# Patient Record
Sex: Male | Born: 1945 | Race: White | Hispanic: No | Smoking: Former smoker
Health system: Southern US, Community
[De-identification: ages and names within clinical notes are randomized; demographics above are authoritative.]

## PROBLEM LIST (undated history)

## (undated) DIAGNOSIS — K7460 Unspecified cirrhosis of liver: Secondary | ICD-10-CM

## (undated) DIAGNOSIS — Z720 Tobacco use: Secondary | ICD-10-CM

## (undated) DIAGNOSIS — C679 Malignant neoplasm of bladder, unspecified: Secondary | ICD-10-CM

## (undated) DIAGNOSIS — N401 Enlarged prostate with lower urinary tract symptoms: Secondary | ICD-10-CM

## (undated) DIAGNOSIS — K279 Peptic ulcer, site unspecified, unspecified as acute or chronic, without hemorrhage or perforation: Secondary | ICD-10-CM

## (undated) DIAGNOSIS — M47812 Spondylosis without myelopathy or radiculopathy, cervical region: Secondary | ICD-10-CM

## (undated) DIAGNOSIS — M7581 Other shoulder lesions, right shoulder: Secondary | ICD-10-CM

## (undated) DIAGNOSIS — B182 Chronic viral hepatitis C: Secondary | ICD-10-CM

## (undated) HISTORY — DX: Other shoulder lesions, right shoulder: M75.81

## (undated) HISTORY — DX: Peptic ulcer, site unspecified, unspecified as acute or chronic, without hemorrhage or perforation: K27.9

## (undated) HISTORY — DX: Chronic viral hepatitis C: B18.2

## (undated) HISTORY — DX: Tobacco use: Z72.0

## (undated) HISTORY — DX: Benign prostatic hyperplasia with lower urinary tract symptoms: N40.1

## (undated) HISTORY — DX: Unspecified cirrhosis of liver: K74.60

## (undated) HISTORY — DX: Malignant neoplasm of bladder, unspecified: C67.9

## (undated) HISTORY — DX: Spondylosis without myelopathy or radiculopathy, cervical region: M47.812

---

## 1974-09-11 HISTORY — PX: REPAIR PERONEAL TENDONS ANKLE: SUR1201

## 1994-09-11 HISTORY — PX: HERNIA REPAIR: SHX51

## 2010-09-11 HISTORY — PX: OTHER SURGICAL HISTORY: SHX169

## 2014-12-14 DIAGNOSIS — N401 Enlarged prostate with lower urinary tract symptoms: Secondary | ICD-10-CM | POA: Insufficient documentation

## 2014-12-14 DIAGNOSIS — B182 Chronic viral hepatitis C: Secondary | ICD-10-CM | POA: Insufficient documentation

## 2014-12-14 DIAGNOSIS — C679 Malignant neoplasm of bladder, unspecified: Secondary | ICD-10-CM | POA: Insufficient documentation

## 2015-03-08 ENCOUNTER — Encounter: Payer: Medicare Other | Attending: Surgery | Admitting: Surgery

## 2015-03-08 DIAGNOSIS — B1920 Unspecified viral hepatitis C without hepatic coma: Secondary | ICD-10-CM | POA: Insufficient documentation

## 2015-03-08 DIAGNOSIS — S81811A Laceration without foreign body, right lower leg, initial encounter: Secondary | ICD-10-CM | POA: Diagnosis not present

## 2015-03-08 DIAGNOSIS — X58XXXA Exposure to other specified factors, initial encounter: Secondary | ICD-10-CM | POA: Diagnosis not present

## 2015-03-08 DIAGNOSIS — F17218 Nicotine dependence, cigarettes, with other nicotine-induced disorders: Secondary | ICD-10-CM | POA: Insufficient documentation

## 2015-03-08 DIAGNOSIS — L03115 Cellulitis of right lower limb: Secondary | ICD-10-CM | POA: Diagnosis not present

## 2015-03-08 DIAGNOSIS — Z8551 Personal history of malignant neoplasm of bladder: Secondary | ICD-10-CM | POA: Insufficient documentation

## 2015-03-08 DIAGNOSIS — K7460 Unspecified cirrhosis of liver: Secondary | ICD-10-CM | POA: Insufficient documentation

## 2015-03-09 NOTE — Progress Notes (Signed)
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 Signature(s) Signed: 03/08/2015 4:47:08 PM By: Christin Fudge MD, FACS Entered By: Christin Fudge on 03/08/2015 14:26:33

## 2015-03-09 NOTE — Progress Notes (Signed)
BOSTYN, BOGIE ([id number]) Visit Report for 03/08/2015 Abuse/Suicide Risk Screen Details Patient Name: [name] Date of Service: 03/08/2015 1:00 PM Medical Record Number: [id number] Patient Account Number: [id number] Date of Birth/Sex: Jan 10, 1946 (69 y.o. Male) Treating RN: Montey Hora Primary Care Physician: BABAOFF, MARCUS Other Clinician: Referring Physician: BABAOFF, MARCUS Treating Physician/Extender: Frann Rider in Treatment: 0 Abuse/Suicide Risk Screen Items Answer ABUSE/SUICIDE RISK SCREEN: Has anyone close to you tried to hurt or harm you recentlyo No Do you feel uncomfortable with anyone in your familyo No Has anyone forced you do things that you didnot want to doo No Do you have any thoughts of harming yourselfo No Patient displays signs or symptoms of abuse and/or neglect. No Electronic Signature(s) Signed: 03/08/2015 5:36:15 PM By: Montey Hora Entered By: Montey Hora on 03/08/2015 13:31:05 [name] ([id number]) -------------------------------------------------------------------------------- Activities of Daily Living Details Patient Name: [name] Date of Service: 03/08/2015 1:00 PM Medical Record Number: [id number] Patient Account Number: [id number] Date of Birth/Sex: 02-21-1946 (69 y.o. Male) Treating RN: Montey Hora Primary Care Physician: Derinda Late Other Clinician: Referring Physician: BABAOFF, MARCUS Treating Physician/Extender: Frann Rider in Treatment: 0 Activities of Daily Living Items Answer Activities of Daily Living (Please select one for each item) Drive Automobile Completely Able Take Medications Completely Able Use Telephone Completely [place] for Appearance Completely Able Use Toilet Completely Able Bath / Shower Completely Able Dress Self Completely Able Feed Self Completely Able Walk Completely Able Get In / Out Bed Completely Able Housework Completely Able Prepare Meals  Completely [place] for Self Completely Able Electronic Signature(s) Signed: 03/08/2015 5:36:15 PM By: Montey Hora Entered By: Montey Hora on 03/08/2015 13:31:25 [name] ([id number]) -------------------------------------------------------------------------------- Education Assessment Details Patient Name: [name] Date of Service: 03/08/2015 1:00 PM Medical Record Number: [id number] Patient Account Number: [id number] Date of Birth/Sex: 21-Sep-1945 (69 y.o. Male) Treating RN: Montey Hora Primary Care Physician: BABAOFF, MARCUS Other Clinician: Referring Physician: BABAOFF, MARCUS Treating Physician/Extender: Frann Rider in Treatment: 0 Primary Learner Assessed: Patient Learning Preferences/Education Level/Primary Language Learning Preference: Explanation, Demonstration Highest Education Level: College or Above Preferred Language: English Cognitive Barrier Assessment/Beliefs Language Barrier: No Translator Needed: No Memory Deficit: No Emotional Barrier: No Cultural/Religious Beliefs Affecting Medical No Care: Physical Barrier Assessment Impaired Vision: No Impaired Hearing: No Decreased Hand dexterity: No Knowledge/Comprehension Assessment Knowledge Level: Medium Comprehension Level: Medium Ability to understand written Medium instructions: Ability to understand verbal Medium instructions: Motivation Assessment Anxiety Level: Calm Cooperation: Cooperative Education Importance: Acknowledges Need Interest in Health Problems: Asks Questions Perception: Coherent Willingness to Engage in Self- Medium Management Activities: Readiness to Engage in Self- Medium Management Activities: Electronic Signature(s) IMAD, [place] ([id number]) Signed: 03/08/2015 5:36:15 PM By: Montey Hora Entered By: Montey Hora on 03/08/2015 13:31:56 [name]  ([id number]) -------------------------------------------------------------------------------- Fall Risk Assessment Details Patient Name: [name] Date of Service: 03/08/2015 1:00 PM Medical Record Number: [id number] Patient Account Number: [id number] Date of Birth/Sex: [birth year] (69 y.o. Male) Treating RN: Montey Hora Primary Care Physician: BABAOFF, MARCUS Other Clinician: Referring Physician: BABAOFF, MARCUS Treating Physician/Extender: Frann Rider in Treatment: 0 Fall Risk Assessment Items FALL RISK ASSESSMENT: History of falling - immediate or within 3 months 0 No Secondary diagnosis 0 No Ambulatory aid None/bed rest/wheelchair/nurse 0 Yes Crutches/cane/walker 0 No Furniture 0 No IV Access/Saline Lock 0 No Gait/Training Normal/bed rest/immobile 0 Yes Weak 0 No Impaired 0 No Mental Status Oriented to own ability 0 Yes Electronic Signature(s) Signed: 03/08/2015 5:36:15 PM By: Montey Hora Entered By: Montey Hora  on 03/08/2015 13:32:02 CHURCHILL, GRIMSLEY ([id number]) -------------------------------------------------------------------------------- Foot Assessment Details Patient Name: [name] Date of Service: 03/08/2015 1:00 PM Medical Record Number: [id number] Patient Account Number: [id number] Date of Birth/Sex: 11/15/1945 (69 y.o. Male) Treating RN: Montey Hora Primary Care Physician: BABAOFF, MARCUS Other Clinician: Referring Physician: BABAOFF, MARCUS Treating Physician/Extender: Frann Rider in Treatment: 0 Foot Assessment Items Site Locations + = Sensation present, - = Sensation absent, C = Callus, U = Ulcer R = Redness, W = Warmth, M = Maceration, PU = Pre-ulcerative lesion F = Fissure, S = Swelling, D = Dryness Assessment Right: Left: Other Deformity: No No Prior Foot Ulcer: No No Prior Amputation: No No Charcot Joint: No No Ambulatory Status: Ambulatory Without Help Gait: Steady Electronic Signature(s) Signed:  03/08/2015 5:36:15 PM By: Montey Hora Entered By: Montey Hora on 03/08/2015 13:51:49 [name] ([id number]) -------------------------------------------------------------------------------- Nutrition Risk Assessment Details Patient Name: [name] Date of Service: 03/08/2015 1:00 PM Medical Record Number: [id number] Patient Account Number: [id number] Date of Birth/Sex: 03-Nov-1945 (69 y.o. Male) Treating RN: Montey Hora Primary Care Physician: Derinda Late Other Clinician: Referring Physician: BABAOFF, MARCUS Treating Physician/Extender: Frann Rider in Treatment: 0 Height (in): Weight (lbs): Body Mass Index (BMI): Nutrition Risk Assessment Items NUTRITION RISK SCREEN: I have an illness or condition that made me change the kind and/or 0 No amount of food I eat I eat fewer than two meals per day 0 No I eat few fruits and vegetables, or milk products 0 No I have three or more drinks of beer, liquor or wine almost every day 0 No I have tooth or mouth problems that make it hard for me to eat 0 No I don't always have enough money to buy the food I need 0 No I eat alone most of the time 0 No I take three or more different prescribed or over-the-counter drugs a 1 Yes day Without wanting to, I have lost or gained 10 pounds in the last six 0 No months I am not always physically able to shop, cook and/or feed myself 0 No Nutrition Protocols Good Risk Protocol 0 No interventions needed Moderate Risk Protocol Electronic Signature(s) Signed: 03/08/2015 5:36:15 PM By: Montey Hora Entered By: Montey Hora on 03/08/2015 13:32:10

## 2015-03-09 NOTE — Progress Notes (Signed)
[name], [name] ([id number]) Visit Report for 03/08/2015 Allergy List Details Patient Name: [name], [name] Date of Service: 03/08/2015 1:00 PM Medical Record Number: [id number] Patient Account Number: [id number] Date of Birth/Sex: Oct 31, 1945 (69 y.o. Male) Treating RN: Montey Hora Primary Care Physician: Derinda Late Other Clinician: Referring Physician: BABAOFF, MARCUS Treating Physician/Extender: Frann Rider in Treatment: 0 Allergies Active Allergies iodine Reaction: anaphylaxis Allergy Notes Electronic Signature(s) Signed: 03/08/2015 5:36:15 PM By: Montey Hora Entered By: Montey Hora on 03/08/2015 13:25:39 [name] ([id number]) -------------------------------------------------------------------------------- [place] Details Patient Name: [name] Date of Service: 03/08/2015 1:00 PM Medical Record Number: [id number] Patient Account Number: [id number] Date of Birth/Sex: 13-May-1946 (69 y.o. Male) Treating RN: Montey Hora Primary Care Physician: BABAOFF, MARCUS Other Clinician: Referring Physician: BABAOFF, MARCUS Treating Physician/Extender: Frann Rider in Treatment: 0 Visit Information Patient Arrived: Ambulatory Arrival Time: 13:24 Accompanied By: self Transfer Assistance: None Patient Identification Verified: Yes Secondary Verification Process Yes Completed: Patient Has Alerts: Yes Patient Alerts: Hep C Electronic Signature(s) Signed: 03/08/2015 5:36:15 PM By: Montey Hora Entered By: Montey Hora on 03/08/2015 13:25:17 [name] ([id number]) -------------------------------------------------------------------------------- Clinic Level of Care Assessment Details Patient Name: [name] Date of Service: 03/08/2015 1:00 PM Medical Record Number: [id number] Patient Account Number: [id number] Date of Birth/Sex: October 24, 1945 (69 y.o. Male) Treating RN: Montey Hora Primary Care Physician:  BABAOFF, MARCUS Other Clinician: Referring Physician: BABAOFF, MARCUS Treating Physician/Extender: Frann Rider in Treatment: 0 Clinic Level of Care Assessment Items TOOL 2 Quantity Score '[]'$  - Use when only an EandM is performed on the INITIAL visit 0 ASSESSMENTS - Nursing Assessment / Reassessment X - General Physical Exam (combine w/ comprehensive assessment (listed just 1 20 below) when performed on new pt. evals) X - Comprehensive Assessment (HX, ROS, Risk Assessments, Wounds Hx, etc.) 1 25 ASSESSMENTS - Wound and Skin Assessment / Reassessment X - Simple Wound Assessment / Reassessment - one wound 1 5 '[]'$  - Complex Wound Assessment / Reassessment - multiple wounds 0 '[]'$  - Dermatologic / Skin Assessment (not related to wound area) 0 ASSESSMENTS - Ostomy and/or Continence Assessment and Care '[]'$  - Incontinence Assessment and Management 0 '[]'$  - Ostomy Care Assessment and Management (repouching, etc.) 0 PROCESS - Coordination of Care X - Simple Patient / Family Education for ongoing care 1 15 '[]'$  - Complex (extensive) Patient / Family Education for ongoing care 0 X - Staff obtains Programmer, systems, Records, Test Results / Process Orders 1 10 '[]'$  - Staff telephones HHA, Nursing Homes / Clarify orders / etc 0 '[]'$  - Routine Transfer to another Facility (non-emergent condition) 0 '[]'$  - Routine Hospital Admission (non-emergent condition) 0 X - New Admissions / Biomedical engineer / Ordering NPWT, Apligraf, etc. 1 15 '[]'$  - Emergency Hospital Admission (emergent condition) 0 X - Simple Discharge Coordination 1 10 [name], Roy ([id number]) '[]'$  - Complex (extensive) Discharge Coordination 0 PROCESS - Special Needs '[]'$  - Pediatric / Minor Patient Management 0 '[]'$  - Isolation Patient Management 0 '[]'$  - Hearing / Language / Visual special needs 0 '[]'$  - Assessment of Community assistance (transportation, D/C planning, etc.) 0 '[]'$  - Additional assistance / Altered mentation 0 '[]'$  - Support  Surface(s) Assessment (bed, cushion, seat, etc.) 0 INTERVENTIONS - Wound Cleansing / Measurement X - Wound Imaging (photographs - any number of wounds) 1 5 '[]'$  - Wound Tracing (instead of photographs) 0 X - Simple Wound Measurement - one wound 1 5 '[]'$  - Complex Wound Measurement - multiple wounds 0 X - Simple Wound Cleansing - one wound 1 5 '[]'$  -  Complex Wound Cleansing - multiple wounds 0 INTERVENTIONS - Wound Dressings X - Small Wound Dressing one or multiple wounds 1 10 '[]'$  - Medium Wound Dressing one or multiple wounds 0 '[]'$  - Large Wound Dressing one or multiple wounds 0 '[]'$  - Application of Medications - injection 0 INTERVENTIONS - Miscellaneous '[]'$  - External ear exam 0 '[]'$  - Specimen Collection (cultures, biopsies, blood, body fluids, etc.) 0 '[]'$  - Specimen(s) / Culture(s) sent or taken to Lab for analysis 0 '[]'$  - Patient Transfer (multiple staff / Harrel Lemon Lift / Similar devices) 0 '[]'$  - Simple Staple / Suture removal (25 or less) 0 '[]'$  - Complex Staple / Suture removal (26 or more) 0 [name], [name] ([id number]) '[]'$  - Hypo / Hyperglycemic Management (close monitor of Blood Glucose) 0 '[]'$  - Ankle / Brachial Index (ABI) - do not check if billed separately 0 Has the patient been seen at the hospital within the last three years: Yes Total Score: 125 Level Of Care: New/Established - Level 4 Electronic Signature(s) Signed: 03/08/2015 5:36:15 PM By: Montey Hora Entered By: Montey Hora on 03/08/2015 14:04:42 [name] ([id number]) -------------------------------------------------------------------------------- Encounter Discharge Information Details Patient Name: [name] Date of Service: 03/08/2015 1:00 PM Medical Record Number: [id number] Patient Account Number: [id number] Date of Birth/Sex: [birth year] (69 y.o. Male) Treating RN: Montey Hora Primary Care Physician: Derinda Late Other Clinician: Referring Physician: BABAOFF, MARCUS Treating Physician/Extender:  Frann Rider in Treatment: 0 Encounter Discharge Information Items Discharge Pain Level: 0 Discharge Condition: Stable Ambulatory Status: Ambulatory Discharge Destination: Home Transportation: Private Auto Accompanied By: self Schedule Follow-up Appointment: Yes Medication Reconciliation completed and provided to Patient/Care No Parisha Beaulac: Provided on Clinical Summary of Care: 03/08/2015 Form Type Recipient Paper Patient JD Electronic Signature(s) Signed: 03/08/2015 2:20:50 PM By: Ruthine Dose Entered By: Ruthine Dose on 03/08/2015 14:20:50 [name] ([id number]) -------------------------------------------------------------------------------- Lower Extremity Assessment Details Patient Name: [name] Date of Service: 03/08/2015 1:00 PM Medical Record Number: [id number] Patient Account Number: [id number] Date of Birth/Sex: Apr 06, 1946 (69 y.o. Male) Treating RN: Montey Hora Primary Care Physician: Derinda Late Other Clinician: Referring Physician: BABAOFF, MARCUS Treating Physician/Extender: Frann Rider in Treatment: 0 Edema Assessment Assessed: [Left: No] [Right: No] E[Left: dema] [Right: :] Calf Left: Right: Point of Measurement: 35 cm From Medial Instep 35.4 cm 36.5 cm Ankle Left: Right: Point of Measurement: 11 cm From Medial Instep 23 cm 25.8 cm Vascular Assessment Pulses: Posterior Tibial Palpable: [Left:Yes] [Right:Yes] Doppler: [Left:Multiphasic] [Right:Multiphasic] Dorsalis Pedis Palpable: [Left:Yes] [Right:Yes] Doppler: [Left:Multiphasic] [Right:Multiphasic] Extremity colors, hair growth, and conditions: Extremity Color: [Left:Normal] [Right:Red] Hair Growth on Extremity: [Left:Yes] [Right:Yes] Temperature of Extremity: [Left:Warm] [Right:Warm] Capillary Refill: [Left:< 3 seconds] [Right:< 3 seconds] Toe Nail Assessment Left: Right: Thick: No No Discolored: No No Deformed: No No Improper Length and Hygiene: No  No Electronic Signature(s) Signed: 03/08/2015 5:36:15 PM By: Veva Holes, Broadus John ([id number]) Entered By: Montey Hora on 03/08/2015 13:43:48 [name] ([id number]) -------------------------------------------------------------------------------- Multi Wound Chart Details Patient Name: [name] Date of Service: 03/08/2015 1:00 PM Medical Record Number: [id number] Patient Account Number: [id number] Date of Birth/Sex: 12-Jan-1946 (69 y.o. Male) Treating RN: Montey Hora Primary Care Physician: BABAOFF, MARCUS Other Clinician: Referring Physician: BABAOFF, MARCUS Treating Physician/Extender: Frann Rider in Treatment: 0 Vital Signs Height(in): 70 Pulse(bpm): 69 Weight(lbs): 168 Blood Pressure 110/55 (mmHg): Body Mass Index(BMI): 24 Temperature(F): 98.2 Respiratory Rate 18 (breaths/min): Photos: [1:No Photos] [N/A:N/A] Wound Location: [1:Right Lower Leg - Medial] [N/A:N/A] Wounding Event: [1:Trauma] [N/A:N/A] Primary Etiology: [1:Trauma, Other] [N/A:N/A] Comorbid History: [1:Cirrhosis , Hepatitis C] [  N/A:N/A] Date Acquired: [1:02/26/2015] [N/A:N/A] Weeks of Treatment: [1:0] [N/A:N/A] Wound Status: [1:Open] [N/A:N/A] Measurements L x W x D 10x2.6x0.1 [N/A:N/A] (cm) Area (cm) : [1:20.42] [N/A:N/A] Volume (cm) : [1:2.042] [N/A:N/A] % Reduction in Area: [1:0.00%] [N/A:N/A] % Reduction in Volume: 0.00% [N/A:N/A] Classification: [1:Full Thickness Without Exposed Support Structures] [N/A:N/A] Exudate Amount: [1:Small] [N/A:N/A] Exudate Type: [1:Sanguinous] [N/A:N/A] Exudate Color: [1:red] [N/A:N/A] Wound Margin: [1:Flat and Intact] [N/A:N/A] Granulation Amount: [1:Small (1-33%)] [N/A:N/A] Granulation Quality: [1:Red] [N/A:N/A] Necrotic Amount: [1:None Present (0%)] [N/A:N/A] Exposed Structures: [1:Fascia: No Fat: No Tendon: No Muscle: No Joint: No Bone: No] [N/A:N/A] Limited to Skin Breakdown Epithelialization: Medium (34-66%) N/A  N/A Periwound Skin Texture: Edema: No N/A N/A Excoriation: No Induration: No Callus: No Crepitus: No Fluctuance: No Friable: No Rash: No Scarring: No Periwound Skin Maceration: No N/A N/A Moisture: Moist: No Dry/Scaly: No Periwound Skin Color: Atrophie Blanche: No N/A N/A Cyanosis: No Ecchymosis: No Erythema: No Hemosiderin Staining: No Mottled: No Pallor: No Rubor: No Temperature: No Abnormality N/A N/A Tenderness on No N/A N/A Palpation: Wound Preparation: Ulcer Cleansing: N/A N/A Rinsed/Irrigated with Saline Topical Anesthetic Applied: Other: lidocaine 4% Treatment Notes Electronic Signature(s) Signed: 03/08/2015 5:36:15 PM By: Montey Hora Entered By: Montey Hora on 03/08/2015 13:52:24 [name] ([id number]) -------------------------------------------------------------------------------- Multi-Disciplinary Care Plan Details Patient Name: [name] Date of Service: 03/08/2015 1:00 PM Medical Record Number: [id number] Patient Account Number: [id number] Date of Birth/Sex: 24-Jun-1946 (69 y.o. Male) Treating RN: Montey Hora Primary Care Physician: BABAOFF, MARCUS Other Clinician: Referring Physician: BABAOFF, MARCUS Treating Physician/Extender: Frann Rider in Treatment: 0 Active Inactive Orientation to the Wound Care Program Nursing Diagnoses: Knowledge deficit related to the wound healing center program Goals: Patient/caregiver will verbalize understanding of the [facility] Date Initiated: 03/08/2015 Goal Status: Active Interventions: Provide education on orientation to the wound center Notes: Wound/Skin Impairment Nursing Diagnoses: Impaired tissue integrity Goals: Ulcer/skin breakdown will have a volume reduction of 30% by week 4 Date Initiated: 03/08/2015 Goal Status: Active Interventions: Assess ulceration(s) every visit Notes: Electronic Signature(s) Signed: 03/08/2015 5:36:15 PM By: Montey Hora Entered By: Montey Hora on 03/08/2015 13:52:54 [name] ([id number]) -------------------------------------------------------------------------------- Patient/Caregiver Education Details Patient Name: [name] Date of Service: 03/08/2015 1:00 PM Medical Record Number: [id number] Patient Account Number: [id number] Date of Birth/Gender: [birth year] (69 y.o. Male) Treating RN: Montey Hora Primary Care Physician: Derinda Late Other Clinician: Referring Physician: BABAOFF, MARCUS Treating Physician/Extender: Frann Rider in Treatment: 0 Education Assessment Education Provided To: Patient Education Topics Provided Wound/Skin Impairment: Handouts: Other: s/s of wound infection by Dr Con Memos; wound care as ordered Methods: Demonstration, Explain/Verbal Responses: State content correctly Electronic Signature(s) Signed: 03/08/2015 5:36:15 PM By: Montey Hora Entered By: Montey Hora on 03/08/2015 14:05:36 [name] ([id number]) -------------------------------------------------------------------------------- Wound Assessment Details Patient Name: [name] Date of Service: 03/08/2015 1:00 PM Medical Record Number: [id number] Patient Account Number: [id number] Date of Birth/Sex: 07-17-1946 (69 y.o. Male) Treating RN: Montey Hora Primary Care Physician: BABAOFF, MARCUS Other Clinician: Referring Physician: BABAOFF, MARCUS Treating Physician/Extender: Frann Rider in Treatment: 0 Wound Status Wound Number: 1 Primary Etiology: Trauma, Other Wound Location: Right Lower Leg - Medial Wound Status: Open Wounding Event: Trauma Comorbid History: Cirrhosis , Hepatitis C Date Acquired: 02/26/2015 Weeks Of Treatment: 0 Clustered Wound: No Photos Photo Uploaded By: Montey Hora on 03/08/2015 17:35:23 Wound Measurements Length: (cm) 10 Width: (cm) 2.6 Depth: (cm) 0.1 Area: (cm) 20.42 Volume: (cm) 2.042 % Reduction in  Area: 0% % Reduction in Volume: 0% Epithelialization: Medium (34-66%) Tunneling: No  Undermining: No Wound Description Full Thickness Without Exposed Foul Odor Afte Classification: Support Structures Wound Margin: Flat and Intact Exudate Small Amount: Exudate Type: Sanguinous Exudate Color: red r Cleansing: No Wound Bed Granulation Amount: Small (1-33%) Exposed Structure Granulation Quality: Red Fascia Exposed: No Necrotic Amount: None Present (0%) Fat Layer Exposed: No Davlin, Elizar ([id number]) Tendon Exposed: No Muscle Exposed: No Joint Exposed: No Bone Exposed: No Limited to Skin Breakdown Periwound Skin Texture Texture Color No Abnormalities Noted: No No Abnormalities Noted: No Callus: No Atrophie Blanche: No Crepitus: No Cyanosis: No Excoriation: No Ecchymosis: No Fluctuance: No Erythema: No Friable: No Hemosiderin Staining: No Induration: No Mottled: No Localized Edema: No Pallor: No Rash: No Rubor: No Scarring: No Temperature / Pain Moisture Temperature: No Abnormality No Abnormalities Noted: No Dry / Scaly: No Maceration: No Moist: No Wound Preparation Ulcer Cleansing: Rinsed/Irrigated with Saline Topical Anesthetic Applied: Other: lidocaine 4%, Treatment Notes Wound #1 (Right, Medial Lower Leg) 1. Cleansed with: Clean wound with Normal Saline 2. Anesthetic Topical Lidocaine 4% cream to wound bed prior to debridement 4. Dressing Applied: Xeroform 5. Secondary Dressing Applied Kerlix/Conform Non-Adherent pad 7. Secured with Recruitment consultant) Signed: 03/08/2015 5:36:15 PM By: Montey Hora Entered By: Montey Hora on 03/08/2015 13:51:32 DHAIRYA, CORALES ([id number]) [name] ([id number]) -------------------------------------------------------------------------------- [place] Details Patient Name: [name] Date of Service: 03/08/2015 1:00 PM Medical Record Number: [id number] Patient Account Number:  [id number] Date of Birth/Sex: [birth year] (69 y.o. Male) Treating RN: Montey Hora Primary Care Physician: BABAOFF, MARCUS Other Clinician: Referring Physician: BABAOFF, MARCUS Treating Physician/Extender: Frann Rider in Treatment: 0 Vital Signs Time Taken: 13:32 Temperature (F): 98.2 Height (in): 70 Pulse (bpm): 69 Source: Stated Respiratory Rate (breaths/min): 18 Weight (lbs): 168 Blood Pressure (mmHg): 110/55 Source: Stated Reference Range: 80 - 120 mg / dl Body Mass Index (BMI): 24.1 Electronic Signature(s) Signed: 03/08/2015 5:36:15 PM By: Montey Hora Entered By: Montey Hora on 03/08/2015 13:35:44

## 2015-03-16 ENCOUNTER — Encounter: Payer: Medicare Other | Attending: Surgery | Admitting: Surgery

## 2015-03-16 DIAGNOSIS — F17218 Nicotine dependence, cigarettes, with other nicotine-induced disorders: Secondary | ICD-10-CM | POA: Diagnosis not present

## 2015-03-16 DIAGNOSIS — B1920 Unspecified viral hepatitis C without hepatic coma: Secondary | ICD-10-CM | POA: Insufficient documentation

## 2015-03-16 DIAGNOSIS — L03115 Cellulitis of right lower limb: Secondary | ICD-10-CM | POA: Insufficient documentation

## 2015-03-16 DIAGNOSIS — K7460 Unspecified cirrhosis of liver: Secondary | ICD-10-CM | POA: Insufficient documentation

## 2015-03-16 DIAGNOSIS — R600 Localized edema: Secondary | ICD-10-CM | POA: Insufficient documentation

## 2015-03-16 DIAGNOSIS — S81811A Laceration without foreign body, right lower leg, initial encounter: Secondary | ICD-10-CM | POA: Diagnosis not present

## 2015-03-16 NOTE — Progress Notes (Addendum)
[name], [name] ([id number]) Visit Report for 03/16/2015 Arrival Information Details Patient Name: [name], [name] Date of Service: 03/16/2015 8:45 AM Medical Record Number: [id number] Patient Account Number: [id number] Date of Birth/Sex: 1946-01-03 (69 y.o. Male) Treating RN: Baruch Gouty, RN, BSN, Velva Harman Primary Care Physician: Derinda Late Other Clinician: Referring Physician: BABAOFF, MARCUS Treating Physician/Extender: [name] in Treatment: 1 Visit Information History Since Last Visit Any new allergies or adverse reactions: No Patient Arrived: Ambulatory Had a fall or experienced change in No Arrival Time: 08:54 activities of daily living that may affect Accompanied By: self risk of falls: Transfer Assistance: None Signs or symptoms of abuse/neglect since last No Patient Identification Verified: Yes visito Secondary Verification Process Yes Hospitalized since last visit: No Completed: Pain Present Now: No Patient Has Alerts: Yes Patient Alerts: Hep C Electronic Signature(s) Signed: 03/16/2015 8:54:57 AM By: Regan Lemming BSN, RN Entered By: Regan Lemming on 03/16/2015 08:54:57 [name] ([id number]) -------------------------------------------------------------------------------- Clinic Level of Care Assessment Details Patient Name: [name] Date of Service: 03/16/2015 8:45 AM Medical Record Number: [id number] Patient Account Number: [id number] Date of Birth/Sex: 1945/11/01 (69 y.o. Male) Treating RN: Baruch Gouty, RN, BSN, Administrator, sports Primary Care Physician: Derinda Late Other Clinician: Referring Physician: BABAOFF, MARCUS Treating Physician/Extender: [name] in Treatment: 1 Clinic Level of Care Assessment Items TOOL 4 Quantity Score '[]'$  - Use when only an EandM is performed on FOLLOW-UP visit 0 ASSESSMENTS - Nursing Assessment / Reassessment X - Reassessment of Co-morbidities (includes updates in patient status) 1 10 X - Reassessment of Adherence to  Treatment Plan 1 5 ASSESSMENTS - Wound and Skin Assessment / Reassessment X - Simple Wound Assessment / Reassessment - one wound 1 5 '[]'$  - Complex Wound Assessment / Reassessment - multiple wounds 0 '[]'$  - Dermatologic / Skin Assessment (not related to wound area) 0 ASSESSMENTS - Focused Assessment '[]'$  - Circumferential Edema Measurements - multi extremities 0 '[]'$  - Nutritional Assessment / Counseling / Intervention 0 '[]'$  - Lower Extremity Assessment (monofilament, tuning fork, pulses) 0 '[]'$  - Peripheral Arterial Disease Assessment (using hand held doppler) 0 ASSESSMENTS - Ostomy and/or Continence Assessment and Care '[]'$  - Incontinence Assessment and Management 0 '[]'$  - Ostomy Care Assessment and Management (repouching, etc.) 0 PROCESS - Coordination of Care X - Simple Patient / Family Education for ongoing care 1 15 '[]'$  - Complex (extensive) Patient / Family Education for ongoing care 0 '[]'$  - Staff obtains Programmer, systems, Records, Test Results / Process Orders 0 '[]'$  - Staff telephones HHA, Nursing Homes / Clarify orders / etc 0 '[]'$  - Routine Transfer to another Facility (non-emergent condition) 0 [name], Roy ([id number]) '[]'$  - Routine Hospital Admission (non-emergent condition) 0 '[]'$  - New Admissions / Biomedical engineer / Ordering NPWT, Apligraf, etc. 0 '[]'$  - Emergency Hospital Admission (emergent condition) 0 '[]'$  - Simple Discharge Coordination 0 '[]'$  - Complex (extensive) Discharge Coordination 0 PROCESS - Special Needs '[]'$  - Pediatric / Minor Patient Management 0 '[]'$  - Isolation Patient Management 0 '[]'$  - Hearing / Language / Visual special needs 0 '[]'$  - Assessment of Community assistance (transportation, D/C planning, etc.) 0 '[]'$  - Additional assistance / Altered mentation 0 '[]'$  - Support Surface(s) Assessment (bed, cushion, seat, etc.) 0 INTERVENTIONS - Wound Cleansing / Measurement X - Simple Wound Cleansing - one wound 1 5 '[]'$  - Complex Wound Cleansing - multiple wounds 0 X - Wound Imaging  (photographs - any number of wounds) 1 5 '[]'$  - Wound Tracing (instead of photographs) 0 X - Simple Wound Measurement - one wound 1  5 '[]'$  - Complex Wound Measurement - multiple wounds 0 INTERVENTIONS - Wound Dressings X - Small Wound Dressing one or multiple wounds 1 10 '[]'$  - Medium Wound Dressing one or multiple wounds 0 '[]'$  - Large Wound Dressing one or multiple wounds 0 '[]'$  - Application of Medications - topical 0 '[]'$  - Application of Medications - injection 0 INTERVENTIONS - Miscellaneous '[]'$  - External ear exam 0 [name], Roy ([id number]) '[]'$  - Specimen Collection (cultures, biopsies, blood, body fluids, etc.) 0 '[]'$  - Specimen(s) / Culture(s) sent or taken to Lab for analysis 0 '[]'$  - Patient Transfer (multiple staff / Harrel Lemon Lift / Similar devices) 0 '[]'$  - Simple Staple / Suture removal (25 or less) 0 '[]'$  - Complex Staple / Suture removal (26 or more) 0 '[]'$  - Hypo / Hyperglycemic Management (close monitor of Blood Glucose) 0 '[]'$  - Ankle / Brachial Index (ABI) - do not check if billed separately 0 X - Vital Signs 1 5 Has the patient been seen at the hospital within the last three years: Yes Total Score: 65 Level Of Care: New/Established - Level 2 Electronic Signature(s) Signed: 03/16/2015 9:04:45 AM By: Regan Lemming BSN, RN Entered By: Regan Lemming on 03/16/2015 09:04:44 [name] ([id number]) -------------------------------------------------------------------------------- Encounter Discharge Information Details Patient Name: [name] Date of Service: 03/16/2015 8:45 AM Medical Record Number: [id number] Patient Account Number: [id number] Date of Birth/Sex: 1946-05-29 (69 y.o. Male) Treating RN: Baruch Gouty, RN, BSN, Velva Harman Primary Care Physician: Derinda Late Other Clinician: Referring Physician: BABAOFF, MARCUS Treating Physician/Extender: [name] in Treatment: 1 Encounter Discharge Information Items Discharge Pain Level: 0 Discharge Condition: Stable Ambulatory  Status: Ambulatory Discharge Destination: Home Private Transportation: Auto Accompanied By: SELF Schedule Follow-up Appointment: No Medication Reconciliation completed and No provided to Patient/Care [name]: Clinical Summary of Care: Electronic Signature(s) Signed: 03/16/2015 9:19:29 AM By: Regan Lemming BSN, RN Entered By: Regan Lemming on 03/16/2015 09:19:29 [name] ([id number]) -------------------------------------------------------------------------------- Lower Extremity Assessment Details Patient Name: [name] Date of Service: 03/16/2015 8:45 AM Medical Record Number: [id number] Patient Account Number: [id number] Date of Birth/Sex: 03-29-1946 (69 y.o. Male) Treating RN: Baruch Gouty, RN, BSN, [place] Primary Care Physician: Derinda Late Other Clinician: Referring Physician: BABAOFF, MARCUS Treating Physician/Extender: [name] in Treatment: 1 Edema Assessment Assessed: [Left: No] [Right: No] Edema: [Left: Ye] [Right: s] Calf Left: Right: Point of Measurement: 35 cm From Medial Instep cm 36.4 cm Ankle Left: Right: Point of Measurement: 11 cm From Medial Instep cm 25.7 cm Vascular Assessment Claudication: Claudication Assessment [Right:None] Pulses: Posterior Tibial Dorsalis Pedis Palpable: [Right:Yes] Extremity colors, hair growth, and conditions: Extremity Color: [Right:Mottled] Hair Growth on Extremity: [Right:No] Temperature of Extremity: [Right:Warm] Capillary Refill: [Right:< 3 seconds] Dependent Rubor: [Right:No] Blanched when Elevated: [Right:No] Lipodermatosclerosis: [Right:No] Toe Nail Assessment Left: Right: Thick: No Discolored: No Deformed: No Improper Length and Hygiene: No [name], [name] ([id number]) Electronic Signature(s) Signed: 03/16/2015 8:58:06 AM By: Regan Lemming BSN, RN Entered By: Regan Lemming on 03/16/2015 08:58:06 [name]  ([id number]) -------------------------------------------------------------------------------- Multi Wound Chart Details Patient Name: [name] Date of Service: 03/16/2015 8:45 AM Medical Record Number: [id number] Patient Account Number: [id number] Date of Birth/Sex: 1946/09/08 (69 y.o. Male) Treating RN: Baruch Gouty, RN, BSN, [place] Primary Care Physician: Derinda Late Other Clinician: Referring Physician: BABAOFF, MARCUS Treating Physician/Extender: [name] in Treatment: 1 Vital Signs Height(in): 70 Pulse(bpm): 63 Weight(lbs): 168 Blood Pressure 117/57 (mmHg): Body Mass Index(BMI): 24 Temperature(F): 98.0 Respiratory Rate 17 (breaths/min): Photos: [1:No Photos] [N/A:N/A] Wound Location: [1:Right Lower Leg - Medial] [N/A:N/A] Wounding Event: [1:Trauma] [  N/A:N/A] Primary Etiology: [1:Trauma, Other] [N/A:N/A] Comorbid History: [1:Cirrhosis , Hepatitis C] [N/A:N/A] Date Acquired: [1:02/26/2015] [N/A:N/A] Weeks of Treatment: [1:1] [N/A:N/A] Wound Status: [1:Open] [N/A:N/A] Measurements L x W x D 14.5x4x0.1 [N/A:N/A] (cm) Area (cm) : [1:45.553] [N/A:N/A] Volume (cm) : [1:4.555] [N/A:N/A] % Reduction in Area: [1:-123.10%] [N/A:N/A] % Reduction in Volume: -123.10% [N/A:N/A] Classification: [1:Full Thickness Without Exposed Support Structures] [N/A:N/A] Exudate Amount: [1:None Present] [N/A:N/A] Wound Margin: [1:Flat and Intact] [N/A:N/A] Granulation Amount: [1:Small (1-33%)] [N/A:N/A] Granulation Quality: [1:Red] [N/A:N/A] Necrotic Amount: [1:Small (1-33%)] [N/A:N/A] Necrotic Tissue: [1:Eschar] [N/A:N/A] Exposed Structures: [1:Fascia: No Fat: No Tendon: No Muscle: No Joint: No Bone: No] [N/A:N/A] Limited to Skin Breakdown Epithelialization: Medium (34-66%) N/A N/A Periwound Skin Texture: Edema: Yes N/A N/A Excoriation: No Induration: No Callus: No Crepitus: No Fluctuance: No Friable: No Rash: No Scarring: No Periwound Skin Dry/Scaly: Yes N/A  N/A Moisture: Maceration: No Moist: No Periwound Skin Color: Atrophie Blanche: No N/A N/A Cyanosis: No Ecchymosis: No Erythema: No Hemosiderin Staining: No Mottled: No Pallor: No Rubor: No Temperature: No Abnormality N/A N/A Tenderness on No N/A N/A Palpation: Wound Preparation: Ulcer Cleansing: N/A N/A Rinsed/Irrigated with Saline Topical Anesthetic Applied: Other: lidocaine 4% Treatment Notes Electronic Signature(s) Signed: 03/16/2015 9:03:19 AM By: Regan Lemming BSN, RN Entered By: Regan Lemming on 03/16/2015 09:03:19 [name] ([id number]) -------------------------------------------------------------------------------- Multi-Disciplinary Care Plan Details Patient Name: [name] Date of Service: 03/16/2015 8:45 AM Medical Record Number: [id number] Patient Account Number: [id number] Date of Birth/Sex: 09-Dec-1945 (69 y.o. Male) Treating RN: Baruch Gouty, RN, BSN, Velva Harman Primary Care Physician: Derinda Late Other Clinician: Referring Physician: BABAOFF, MARCUS Treating Physician/Extender: [name] in Treatment: 1 Active Inactive Orientation to the Wound Care Program Nursing Diagnoses: Knowledge deficit related to the wound healing center program Goals: Patient/caregiver will verbalize understanding of the [facility] Program Date Initiated: 03/08/2015 Goal Status: Active Interventions: Provide education on orientation to the wound center Notes: Wound/Skin Impairment Nursing Diagnoses: Impaired tissue integrity Goals: Ulcer/skin breakdown will have a volume reduction of 30% by week 4 Date Initiated: 03/08/2015 Goal Status: Active Interventions: Assess ulceration(s) every visit Notes: Electronic Signature(s) Signed: 03/16/2015 9:02:03 AM By: Regan Lemming BSN, RN Entered By: Regan Lemming on 03/16/2015 09:02:03 [name] ([id number]) -------------------------------------------------------------------------------- Pain Assessment  Details Patient Name: [name] Date of Service: 03/16/2015 8:45 AM Medical Record Number: [id number] Patient Account Number: [id number] Date of Birth/Sex: 1945-12-14 (69 y.o. Male) Treating RN: Baruch Gouty, RN, BSN, Velva Harman Primary Care Physician: Derinda Late Other Clinician: Referring Physician: BABAOFF, MARCUS Treating Physician/Extender: [name] in Treatment: 1 Active Problems Location of Pain Severity and Description of Pain Patient Has Paino No Site Locations Pain Management and Medication Current Pain Management: Electronic Signature(s) Signed: 03/16/2015 8:55:04 AM By: Regan Lemming BSN, RN Entered By: Regan Lemming on 03/16/2015 08:55:04 [name] ([id number]) -------------------------------------------------------------------------------- Patient/Caregiver Education Details Patient Name: [name] Date of Service: 03/16/2015 8:45 AM Medical Record Number: [id number] Patient Account Number: [id number] Date of Birth/Gender: 1946-05-10 (69 y.o. Male) Treating RN: Baruch Gouty, RN, BSN, Velva Harman Primary Care Physician: Derinda Late Other Clinician: Referring Physician: BABAOFF, MARCUS Treating Physician/Extender: [name] in Treatment: 1 Education Assessment Education Provided To: Patient Education Topics Provided Basic Hygiene: Methods: Explain/Verbal Responses: State content correctly Welcome To [facility]: Methods: Explain/Verbal Responses: State content correctly Wound/Skin Impairment: Methods: Explain/Verbal Responses: State content correctly Electronic Signature(s) Signed: 03/16/2015 9:19:46 AM By: Regan Lemming BSN, RN Entered By: Regan Lemming on 03/16/2015 09:19:46 [name] ([id number]) -------------------------------------------------------------------------------- Wound Assessment Details Patient Name: [name] Date of  Service: 03/16/2015 8:45 AM Medical Record Number: [id number] Patient Account Number:  [id number] Date of Birth/Sex: 1946-06-02 (69 y.o. Male) Treating RN: Afful, RN, BSN, [place] Primary Care Physician: BABAOFF, MARCUS Other Clinician: Referring Physician: BABAOFF, MARCUS Treating Physician/Extender: [name] in Treatment: 1 Wound Status Wound Number: 1 Primary Etiology: Trauma, Other Wound Location: Right Lower Leg - Medial Wound Status: Open Wounding Event: Trauma Comorbid History: Cirrhosis , Hepatitis C Date Acquired: 02/26/2015 Weeks Of Treatment: 1 Clustered Wound: No Wound Measurements Length: (cm) 14.5 Width: (cm) 4 Depth: (cm) 0.1 Area: (cm) 45.553 Volume: (cm) 4.555 % Reduction in Area: -123.1% % Reduction in Volume: -123.1% Epithelialization: Medium (34-66%) Tunneling: No Undermining: No Wound Description Full Thickness Without Exposed Foul Odor Afte Classification: Support Structures Wound Margin: Flat and Intact Exudate None Present Amount: r Cleansing: No Wound Bed Granulation Amount: Small (1-33%) Exposed Structure Granulation Quality: Red Fascia Exposed: No Necrotic Amount: Small (1-33%) Fat Layer Exposed: No Necrotic Quality: Eschar Tendon Exposed: No Muscle Exposed: No Joint Exposed: No Bone Exposed: No Limited to Skin Breakdown Periwound Skin Texture Texture Color No Abnormalities Noted: No No Abnormalities Noted: No Callus: No Atrophie Blanche: No Crepitus: No Cyanosis: No Excoriation: No Ecchymosis: No Fluctuance: No Erythema: No Groninger, Kharee ([id number]) Friable: No Hemosiderin Staining: No Induration: No Mottled: No Localized Edema: Yes Pallor: No Rash: No Rubor: No Scarring: No Temperature / Pain Moisture Temperature: No Abnormality No Abnormalities Noted: No Dry / Scaly: Yes Maceration: No Moist: No Wound Preparation Ulcer Cleansing: Rinsed/Irrigated with Saline Topical Anesthetic Applied: Other: lidocaine 4%, Treatment Notes Wound #1 (Right, Medial Lower Leg) 1. Cleansed  with: Clean wound with Normal Saline 4. Dressing Applied: Xeroform 5. Secondary Dressing Applied Gauze and Kerlix/Conform 7. Secured with Recruitment consultant) Signed: 03/16/2015 8:59:02 AM By: Regan Lemming BSN, RN Entered By: Regan Lemming on 03/16/2015 08:59:02 [name] ([id number]) -------------------------------------------------------------------------------- Vitals Details Patient Name: [name] Date of Service: 03/16/2015 8:45 AM Medical Record Number: [id number] Patient Account Number: [id number] Date of Birth/Sex: [birth year] (69 y.o. Male) Treating RN: Afful, RN, BSN, [place] Primary Care Physician: BABAOFF, MARCUS Other Clinician: Referring Physician: BABAOFF, MARCUS Treating Physician/Extender: [name] in Treatment: 1 Vital Signs Time Taken: 08:55 Temperature (F): 98.0 Height (in): 70 Pulse (bpm): 63 Weight (lbs): 168 Respiratory Rate (breaths/min): 17 Body Mass Index (BMI): 24.1 Blood Pressure (mmHg): 117/57 Reference Range: 80 - 120 mg / dl Electronic Signature(s) Signed: 03/16/2015 8:57:23 AM By: Regan Lemming BSN, RN Entered By: Regan Lemming on 03/16/2015 08:57:23
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
## 2015-03-25 ENCOUNTER — Encounter: Payer: Medicare Other | Admitting: Surgery

## 2015-03-25 DIAGNOSIS — S81811A Laceration without foreign body, right lower leg, initial encounter: Secondary | ICD-10-CM | POA: Diagnosis not present

## 2015-03-25 NOTE — Progress Notes (Signed)
KOSISOCHUKWU, BURNINGHAM ([id number]) Visit Report for 03/25/2015 Arrival Information Details Patient Name: [name], [name] Date of Service: 03/25/2015 8:00 AM Medical Record Number: [id number] Patient Account Number: [id number] Date of Birth/Sex: July 19, 1946 (69 y.o. Male) Treating RN: Baruch Gouty, RN, BSN, Velva Harman Primary Care Physician: Derinda Late Other Clinician: Referring Physician: BABAOFF, MARCUS Treating Physician/Extender: Frann Rider in Treatment: 2 Visit Information History Since Last Visit Added or deleted any medications: No Patient Arrived: Ambulatory Any new allergies or adverse reactions: No Arrival Time: 08:11 Had a fall or experienced change in No Accompanied By: SELF activities of daily living that may affect Transfer Assistance: None risk of falls: Patient Identification Verified: Yes Signs or symptoms of abuse/neglect since last No Secondary Verification Process Yes visito Completed: Hospitalized since last visit: No Patient Requires Transmission-Based No Has Dressing in Place as Prescribed: Yes Precautions: Pain Present Now: No Patient Has Alerts: Yes Electronic Signature(s) Signed: 03/25/2015 8:11:50 AM By: Regan Lemming BSN, RN Entered By: Regan Lemming on 03/25/2015 08:11:50 [name] ([id number]) -------------------------------------------------------------------------------- Encounter Discharge Information Details Patient Name: [name] Date of Service: 03/25/2015 8:00 AM Medical Record Number: [id number] Patient Account Number: [id number] Date of Birth/Sex: 09/21/1945 (69 y.o. Male) Treating RN: Baruch Gouty, RN, BSN, Velva Harman Primary Care Physician: Derinda Late Other Clinician: Referring Physician: BABAOFF, MARCUS Treating Physician/Extender: Frann Rider in Treatment: 2 Encounter Discharge Information Items Discharge Pain Level: 0 Discharge Condition: Stable Ambulatory Status: Ambulatory Discharge Destination:  Home Private Transportation: Auto Accompanied By: SELF Schedule Follow-up Appointment: No Medication Reconciliation completed and No provided to Patient/Care Byrne [place]: Clinical Summary of Care: Electronic Signature(s) Signed: 03/25/2015 8:48:21 AM By: Regan Lemming BSN, RN Entered By: Regan Lemming on 03/25/2015 08:48:21 [name] ([id number]) -------------------------------------------------------------------------------- Lower Extremity Assessment Details Patient Name: [name] Date of Service: 03/25/2015 8:00 AM Medical Record Number: [id number] Patient Account Number: [id number] Date of Birth/Sex: Jan 16, 1946 (69 y.o. Male) Treating RN: Baruch Gouty, RN, BSN, [place] Primary Care Physician: Derinda Late Other Clinician: Referring Physician: BABAOFF, MARCUS Treating Physician/Extender: Frann Rider in Treatment: 2 Edema Assessment Assessed: [Left: No] [Right: No] Edema: [Left: Ye] [Right: s] Calf Left: Right: Point of Measurement: 35 cm From Medial Instep cm 35.6 cm Ankle Left: Right: Point of Measurement: 11 cm From Medial Instep cm 25.2 cm Vascular Assessment Claudication: Claudication Assessment [Right:None] Pulses: Posterior Tibial Dorsalis Pedis Palpable: [Right:Yes] Extremity colors, hair growth, and conditions: Extremity Color: [Right:Mottled] Hair Growth on Extremity: [Right:Yes] Temperature of Extremity: [Right:Warm] Capillary Refill: [Right:< 3 seconds] Dependent Rubor: [Right:No] Blanched when Elevated: [Right:No] Lipodermatosclerosis: [Right:No] Toe Nail Assessment Left: Right: Thick: No Discolored: No Deformed: No Improper Length and Hygiene: No [name], [name] ([id number]) Electronic Signature(s) Signed: 03/25/2015 8:15:54 AM By: Regan Lemming BSN, RN Entered By: Regan Lemming on 03/25/2015 08:15:54 [name] ([id number]) -------------------------------------------------------------------------------- Multi Wound Chart  Details Patient Name: [name] Date of Service: 03/25/2015 8:00 AM Medical Record Number: [id number] Patient Account Number: [id number] Date of Birth/Sex: [birth year] (69 y.o. Male) Treating RN: Baruch Gouty, RN, BSN, [place] Primary Care Physician: Derinda Late Other Clinician: Referring Physician: BABAOFF, MARCUS Treating Physician/Extender: Frann Rider in Treatment: 2 Vital Signs Height(in): 70 Pulse(bpm): 54 Weight(lbs): 168 Blood Pressure 110/57 (mmHg): Body Mass Index(BMI): 24 Temperature(F): 97.8 Respiratory Rate 16 (breaths/min): Photos: [1:No Photos] [N/A:N/A] Wound Location: [1:Right Lower Leg - Medial] [N/A:N/A] Wounding Event: [1:Trauma] [N/A:N/A] Primary Etiology: [1:Trauma, Other] [N/A:N/A] Comorbid History: [1:Cirrhosis , Hepatitis C] [N/A:N/A] Date Acquired: [1:02/26/2015] [N/A:N/A] Weeks of Treatment: [1:2] [N/A:N/A] Wound Status: [1:Open] [N/A:N/A] Measurements L x W x D 14.5x3.6x0.1 [N/A:N/A] (cm) Area (cm) : [  1:40.998] [N/A:N/A] Volume (cm) : [1:4.1] [N/A:N/A] % Reduction in Area: [1:-100.80%] [N/A:N/A] % Reduction in Volume: -100.80% [N/A:N/A] Classification: [1:Full Thickness Without Exposed Support Structures] [N/A:N/A] Exudate Amount: [1:None Present] [N/A:N/A] Wound Margin: [1:Flat and Intact] [N/A:N/A] Granulation Amount: [1:Small (1-33%)] [N/A:N/A] Granulation Quality: [1:Red] [N/A:N/A] Necrotic Amount: [1:Small (1-33%)] [N/A:N/A] Necrotic Tissue: [1:Eschar] [N/A:N/A] Exposed Structures: [1:Fascia: No Fat: No Tendon: No Muscle: No Joint: No Bone: No] [N/A:N/A] Limited to Skin Breakdown Epithelialization: Medium (34-66%) N/A N/A Periwound Skin Texture: Edema: Yes N/A N/A Excoriation: No Induration: No Callus: No Crepitus: No Fluctuance: No Friable: No Rash: No Scarring: No Periwound Skin Dry/Scaly: Yes N/A N/A Moisture: Maceration: No Moist: No Periwound Skin Color: Atrophie Blanche: No N/A N/A Cyanosis:  No Ecchymosis: No Erythema: No Hemosiderin Staining: No Mottled: No Pallor: No Rubor: No Temperature: No Abnormality N/A N/A Tenderness on No N/A N/A Palpation: Wound Preparation: Ulcer Cleansing: N/A N/A Rinsed/Irrigated with Saline Topical Anesthetic Applied: Other: lidocaine 4% Treatment Notes Electronic Signature(s) Signed: 03/25/2015 8:27:55 AM By: Regan Lemming BSN, RN Entered By: Regan Lemming on 03/25/2015 08:27:55 [name] ([id number]) -------------------------------------------------------------------------------- [place] Details Patient Name: [name] Date of Service: 03/25/2015 8:00 AM Medical Record Number: [id number] Patient Account Number: [id number] Date of Birth/Sex: [birth year] (69 y.o. Male) Treating RN: Baruch Gouty, RN, BSN, Velva Harman Primary Care Physician: Derinda Late Other Clinician: Referring Physician: BABAOFF, MARCUS Treating Physician/Extender: Frann Rider in Treatment: 2 Active Inactive Orientation to the Wound Care Program Nursing Diagnoses: Knowledge deficit related to the wound healing center program Goals: Patient/caregiver will verbalize understanding of the [place] Program Date Initiated: 03/08/2015 Goal Status: Active Interventions: Provide education on orientation to the wound center Notes: Wound/Skin Impairment Nursing Diagnoses: Impaired tissue integrity Goals: Ulcer/skin breakdown will have a volume reduction of 30% by week 4 Date Initiated: 03/08/2015 Goal Status: Active Interventions: Assess ulceration(s) every visit Notes: Electronic Signature(s) Signed: 03/25/2015 8:23:42 AM By: Regan Lemming BSN, RN Entered By: Regan Lemming on 03/25/2015 08:23:42 [name] ([id number]) -------------------------------------------------------------------------------- Pain Assessment Details Patient Name: [name] Date of Service: 03/25/2015 8:00 AM Medical Record Number:  [id number] Patient Account Number: [id number] Date of Birth/Sex: 1946/05/06 (69 y.o. Male) Treating RN: Baruch Gouty, RN, BSN, [place] Primary Care Physician: Derinda Late Other Clinician: Referring Physician: BABAOFF, MARCUS Treating Physician/Extender: Frann Rider in Treatment: 2 Active Problems Location of Pain Severity and Description of Pain Patient Has Paino No Site Locations Pain Management and Medication Current Pain Management: Electronic Signature(s) Signed: 03/25/2015 8:14:37 AM By: Regan Lemming BSN, RN Entered By: Regan Lemming on 03/25/2015 08:14:37 [name] ([id number]) -------------------------------------------------------------------------------- Patient/Caregiver Education Details Patient Name: [name] Date of Service: 03/25/2015 8:00 AM Medical Record Number: [id number] Patient Account Number: [id number] Date of Birth/Gender: 1946-09-03 (69 y.o. Male) Treating RN: Baruch Gouty, RN, BSN, [place] Har[place] Primary Care Physician: Derinda Late Other Clinician: Referring Physician: BABAOFF, MARCUS Treating Physician/Extender: Frann Rider in Treatment: 2 Education Assessment Education Provided To: Patient Education Topics Provided Basic Hygiene: Methods: Explain/Verbal Responses: State content correctly Welcome To [facility]: Methods: Explain/Verbal Responses: State content correctly Electronic Signature(s) Signed: 03/25/2015 8:48:40 AM By: Regan Lemming BSN, RN Entered By: Regan Lemming on 03/25/2015 08:48:40 [name] ([id number]) -------------------------------------------------------------------------------- Wound Assessment Details Patient Name: [name] Date of Service: 03/25/2015 8:00 AM Medical Record Number: [id number] Patient Account Number: [id number] Date of Birth/Sex: June 24, 1946 (69 y.o. Male) Treating RN: Baruch Gouty, RN, BSN, [place] Har[place] Primary Care Physician: Derinda Late Other Clinician: Referring Physician: BABAOFF,  MARCUS Treating Physician/Extender: Frann Rider in Treatment: 2 Wound  Status Wound Number: 1 Primary Etiology: Trauma, Other Wound Location: Right Lower Leg - Medial Wound Status: Open Wounding Event: Trauma Comorbid History: Cirrhosis , Hepatitis C Date Acquired: 02/26/2015 Weeks Of Treatment: 2 Clustered Wound: No Wound Measurements Length: (cm) 14.5 Width: (cm) 3.6 Depth: (cm) 0.1 Area: (cm) 40.998 Volume: (cm) 4.1 % Reduction in Area: -100.8% % Reduction in Volume: -100.8% Epithelialization: Medium (34-66%) Tunneling: No Undermining: No Wound Description Full Thickness Without Exposed Foul Odor Afte Classification: Support Structures Wound Margin: Flat and Intact Exudate None Present Amount: r Cleansing: No Wound Bed Granulation Amount: Small (1-33%) Exposed Structure Granulation Quality: Red Fascia Exposed: No Necrotic Amount: Small (1-33%) Fat Layer Exposed: No Necrotic Quality: Eschar Tendon Exposed: No Muscle Exposed: No Joint Exposed: No Bone Exposed: No Limited to Skin Breakdown Periwound Skin Texture Texture Color No Abnormalities Noted: No No Abnormalities Noted: No Callus: No Atrophie Blanche: No Crepitus: No Cyanosis: No Excoriation: No Ecchymosis: No Fluctuance: No Erythema: No [name], [name] ([id number]) Friable: No Hemosiderin Staining: No Induration: No Mottled: No Localized Edema: Yes Pallor: No Rash: No Rubor: No Scarring: No Temperature / Pain Moisture Temperature: No Abnormality No Abnormalities Noted: No Dry / Scaly: Yes Maceration: No Moist: No Wound Preparation Ulcer Cleansing: Rinsed/Irrigated with Saline Topical Anesthetic Applied: Other: lidocaine 4%, Treatment Notes Wound #1 (Right, Medial Lower Leg) 1. Cleansed with: Clean wound with Normal Saline 3. Peri-wound Care: Skin Prep 4. Dressing Applied: Aquacel Ag 5. Secondary Dressing Applied Gauze and Kerlix/Conform 7. Secured  with Recruitment consultant) Signed: 03/25/2015 8:17:33 AM By: Regan Lemming BSN, RN Entered By: Regan Lemming on 03/25/2015 08:17:33 [name] ([id number]) -------------------------------------------------------------------------------- Vitals Details Patient Name: [name] Date of Service: 03/25/2015 8:00 AM Medical Record Number: [id number] Patient Account Number: [id number] Date of Birth/Sex: 06/10/1946 (69 y.o. Male) Treating RN: Afful, RN, BSN, [place] Primary Care Physician: Derinda Late Other Clinician: Referring Physician: BABAOFF, MARCUS Treating Physician/Extender: Frann Rider in Treatment: 2 Vital Signs Time Taken: 08:14 Temperature (F): 97.8 Height (in): 70 Pulse (bpm): 54 Weight (lbs): 168 Respiratory Rate (breaths/min): 16 Body Mass Index (BMI): 24.1 Blood Pressure (mmHg): 110/57 Reference Range: 80 - 120 mg / dl Electronic Signature(s) Signed: 03/25/2015 8:15:02 AM By: Regan Lemming BSN, RN Entered By: Regan Lemming on 03/25/2015 08:15:02
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
## 2015-04-01 ENCOUNTER — Encounter: Payer: Medicare Other | Admitting: Surgery

## 2015-04-01 DIAGNOSIS — S81811A Laceration without foreign body, right lower leg, initial encounter: Secondary | ICD-10-CM | POA: Diagnosis not present
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
## 2015-04-01 NOTE — Progress Notes (Addendum)
[name], [name] ([id number]) Visit Report for 04/01/2015 Arrival Information Details Patient Name: [name], [name] Date of Service: 04/01/2015 8:00 AM Medical Record Number: [id number] Patient Account Number: [id number] Date of Birth/Sex: 1946/01/01 (69 y.o. Male) Treating RN: Baruch Gouty, RN, BSN, Velva Harman Primary Care Physician: Derinda Late Other Clinician: Referring Physician: BABAOFF, MARCUS Treating Physician/Extender: Frann Rider in Treatment: 3 Visit Information History Since Last Visit Any new allergies or adverse reactions: No Patient Arrived: Ambulatory Had a fall or experienced change in No Arrival Time: 08:08 activities of daily living that may affect Accompanied By: self risk of falls: Transfer Assistance: None Signs or symptoms of abuse/neglect since last No Patient Identification Verified: Yes visito Secondary Verification Process Yes Hospitalized since last visit: No Completed: Has Dressing in Place as Prescribed: Yes Patient Requires Transmission-Based No Pain Present Now: No Precautions: Patient Has Alerts: Yes Electronic Signature(s) Signed: 04/01/2015 8:10:46 AM By: Regan Lemming BSN, RN Entered By: Regan Lemming on 04/01/2015 08:10:46 [name] ([id number]) -------------------------------------------------------------------------------- Encounter Discharge Information Details Patient Name: [name] Date of Service: 04/01/2015 8:00 AM Medical Record Number: [id number] Patient Account Number: [id number] Date of Birth/Sex: [birth year] (69 y.o. Male) Treating RN: Baruch Gouty, RN, BSN, Velva Harman Primary Care Physician: Derinda Late Other Clinician: Referring Physician: BABAOFF, MARCUS Treating Physician/Extender: Frann Rider in Treatment: 3 Encounter Discharge Information Items Discharge Pain Level: 0 Discharge Condition: Stable Ambulatory Status: Ambulatory Discharge Destination: Home Private Transportation: Auto Accompanied By:  self Schedule Follow-up Appointment: No Medication Reconciliation completed and No provided to Patient/Care Rabon Scholle: Clinical Summary of Care: Electronic Signature(s) Signed: 04/02/2015 2:16:09 PM By: Regan Lemming BSN, RN Entered By: Regan Lemming on 04/01/2015 08:44:53 [name] ([id number]) -------------------------------------------------------------------------------- Lower Extremity Assessment Details Patient Name: [name] Date of Service: 04/01/2015 8:00 AM Medical Record Number: [id number] Patient Account Number: [id number] Date of Birth/Sex: [birth year] (69 y.o. Male) Treating RN: Baruch Gouty, RN, BSN, [place] Primary Care Physician: Derinda Late Other Clinician: Referring Physician: BABAOFF, MARCUS Treating Physician/Extender: Frann Rider in Treatment: 3 Edema Assessment Assessed: [Left: No] [Right: No] Edema: [Left: Ye] [Right: s] Calf Left: Right: Point of Measurement: 35 cm From Medial Instep cm 35.8 cm Ankle Left: Right: Point of Measurement: 11 cm From Medial Instep cm 25.3 cm Vascular Assessment Claudication: Claudication Assessment [Right:None] Pulses: Posterior Tibial Dorsalis Pedis Palpable: [Right:Yes] Extremity colors, hair growth, and conditions: Extremity Color: [Right:Mottled] Hair Growth on Extremity: [Right:No] Temperature of Extremity: [Right:Warm] Capillary Refill: [Right:< 3 seconds] Toe Nail Assessment Left: Right: Thick: No Discolored: No Deformed: No Improper Length and Hygiene: No Electronic Signature(s) Signed: 04/01/2015 8:12:19 AM By: Regan Lemming BSN, RN Entered By: Regan Lemming on 04/01/2015 08:12:19 [name] ([id number]) Katharine Look, Broadus John ([id number]) -------------------------------------------------------------------------------- Multi Wound Chart Details Patient Name: [name] Date of Service: 04/01/2015 8:00 AM Medical Record Number: [id number] Patient Account Number: [id number] Date of  Birth/Sex: Sep 05, 1946 (69 y.o. Male) Treating RN: Baruch Gouty, RN, BSN, [place] Primary Care Physician: Derinda Late Other Clinician: Referring Physician: BABAOFF, MARCUS Treating Physician/Extender: Frann Rider in Treatment: 3 Vital Signs Height(in): 70 Pulse(bpm): 57 Weight(lbs): 168 Blood Pressure 107/57 (mmHg): Body Mass Index(BMI): 24 Temperature(F): 98.1 Respiratory Rate 16 (breaths/min): Photos: [1:No Photos] [N/A:N/A] Wound Location: [1:Right Lower Leg - Medial] [N/A:N/A] Wounding Event: [1:Trauma] [N/A:N/A] Primary Etiology: [1:Trauma, Other] [N/A:N/A] Comorbid History: [1:Cirrhosis , Hepatitis C] [N/A:N/A] Date Acquired: [1:02/26/2015] [N/A:N/A] Weeks of Treatment: [1:3] [N/A:N/A] Wound Status: [1:Open] [N/A:N/A] Measurements L x W x D 11x3x0.1 [N/A:N/A] (cm) Area (cm) : [1:25.918] [N/A:N/A] Volume (cm) : [1:2.592] [N/A:N/A] % Reduction in Area: [1:-26.90%] [N/A:N/A] %  Reduction in Volume: -26.90% [N/A:N/A] Classification: [1:Full Thickness Without Exposed Support Structures] [N/A:N/A] Exudate Amount: [1:Small] [N/A:N/A] Exudate Type: [1:Serous] [N/A:N/A] Exudate Color: [1:amber] [N/A:N/A] Wound Margin: [1:Flat and Intact] [N/A:N/A] Granulation Amount: [1:Small (1-33%)] [N/A:N/A] Granulation Quality: [1:Red] [N/A:N/A] Necrotic Amount: [1:Small (1-33%)] [N/A:N/A] Necrotic Tissue: [1:Eschar] [N/A:N/A] Exposed Structures: [1:Fascia: No Fat: No Tendon: No Muscle: No Joint: No] [N/A:N/A] Bone: No Limited to Skin Breakdown Epithelialization: Medium (34-66%) N/A N/A Periwound Skin Texture: Edema: Yes N/A N/A Excoriation: No Induration: No Callus: No Crepitus: No Fluctuance: No Friable: No Rash: No Scarring: No Periwound Skin Dry/Scaly: Yes N/A N/A Moisture: Maceration: No Moist: No Periwound Skin Color: Atrophie Blanche: No N/A N/A Cyanosis: No Ecchymosis: No Erythema: No Hemosiderin Staining: No Mottled: No Pallor: No Rubor:  No Temperature: No Abnormality N/A N/A Tenderness on No N/A N/A Palpation: Wound Preparation: Ulcer Cleansing: N/A N/A Rinsed/Irrigated with Saline Topical Anesthetic Applied: Other: lidocaine 4% Treatment Notes Electronic Signature(s) Signed: 04/01/2015 8:14:28 AM By: Regan Lemming BSN, RN Entered By: Regan Lemming on 04/01/2015 08:14:28 [name] ([id number]) -------------------------------------------------------------------------------- Multi-Disciplinary Care Plan Details Patient Name: [name] Date of Service: 04/01/2015 8:00 AM Medical Record Number: [id number] Patient Account Number: [id number] Date of Birth/Sex: 01-12-1946 (69 y.o. Male) Treating RN: Baruch Gouty, RN, BSN, [place] Harman Primary Care Physician: Derinda Late Other Clinician: Referring Physician: BABAOFF, MARCUS Treating Physician/Extender: Frann Rider in Treatment: 3 Active Inactive Orientation to the Wound Care Program Nursing Diagnoses: Knowledge deficit related to the wound healing center program Goals: Patient/caregiver will verbalize understanding of the [place] Program Date Initiated: 03/08/2015 Goal Status: Active Interventions: Provide education on orientation to the wound center Notes: Wound/Skin Impairment Nursing Diagnoses: Impaired tissue integrity Goals: Ulcer/skin breakdown will have a volume reduction of 30% by week 4 Date Initiated: 03/08/2015 Goal Status: Active Interventions: Assess ulceration(s) every visit Notes: Electronic Signature(s) Signed: 04/01/2015 8:14:21 AM By: Regan Lemming BSN, RN Entered By: Regan Lemming on 04/01/2015 08:14:21 [name] ([id number]) -------------------------------------------------------------------------------- Pain Assessment Details Patient Name: [name] Date of Service: 04/01/2015 8:00 AM Medical Record Number: [id number] Patient Account Number: [id number] Date of Birth/Sex: 28-Apr-1946 (69 y.o. Male) Treating  RN: Baruch Gouty, RN, BSN, [place] Primary Care Physician: Derinda Late Other Clinician: Referring Physician: BABAOFF, MARCUS Treating Physician/Extender: Frann Rider in Treatment: 3 Active Problems Location of Pain Severity and Description of Pain Patient Has Paino No Site Locations Pain Management and Medication Current Pain Management: Electronic Signature(s) Signed: 04/01/2015 8:10:53 AM By: Regan Lemming BSN, RN Entered By: Regan Lemming on 04/01/2015 08:10:53 [name] ([id number]) -------------------------------------------------------------------------------- Patient/Caregiver Education Details Patient Name: [name] Date of Service: 04/01/2015 8:00 AM Medical Record Number: [id number] Patient Account Number: [id number] Date of Birth/Gender: 1946-06-13 (69 y.o. Male) Treating RN: Montey Hora Primary Care Physician: Derinda Late Other Clinician: Referring Physician: BABAOFF, MARCUS Treating Physician/Extender: Frann Rider in Treatment: 3 Education Assessment Education Provided To: Patient Education Topics Provided Wound/Skin Impairment: Handouts: Other: wound healing, wound care as ordered and juxtalite Methods: Demonstration, Explain/Verbal Responses: State content correctly Electronic Signature(s) Signed: 04/01/2015 4:33:06 PM By: Montey Hora Entered By: Montey Hora on 04/01/2015 08:29:02 [name] ([id number]) -------------------------------------------------------------------------------- Wound Assessment Details Patient Name: [name] Date of Service: 04/01/2015 8:00 AM Medical Record Number: [id number] Patient Account Number: [id number] Date of Birth/Sex: [birth year] (69 y.o. Male) Treating RN: Baruch Gouty, RN, BSN, [place] Har[place] Primary Care Physician: Derinda Late Other Clinician: Referring Physician: BABAOFF, MARCUS Treating Physician/Extender: Frann Rider in Treatment: 3 Wound Status Wound Number: 1 Primary  Etiology: Trauma, Other Wound Location: Right Lower  Leg - Medial Wound Status: Open Wounding Event: Trauma Comorbid History: Cirrhosis , Hepatitis C Date Acquired: 02/26/2015 Weeks Of Treatment: 3 Clustered Wound: No Photos Photo Uploaded By: Regan Lemming on 04/01/2015 12:04:33 Wound Measurements Length: (cm) 11 Width: (cm) 3 Depth: (cm) 0.1 Area: (cm) 25.918 Volume: (cm) 2.592 % Reduction in Area: -26.9% % Reduction in Volume: -26.9% Epithelialization: Medium (34-66%) Tunneling: No Undermining: No Wound Description Full Thickness Without Exposed Foul Odor Aft Classification: Support Structures Wound Margin: Flat and Intact Exudate Small Amount: Exudate Type: Serous Exudate Color: amber er Cleansing: No Wound Bed Granulation Amount: Small (1-33%) Exposed Structure Granulation Quality: Red Fascia Exposed: No Necrotic Amount: Small (1-33%) Fat Layer Exposed: No Bir, Crystian ([id number]) Necrotic Quality: Eschar Tendon Exposed: No Muscle Exposed: No Joint Exposed: No Bone Exposed: No Limited to Skin Breakdown Periwound Skin Texture Texture Color No Abnormalities Noted: No No Abnormalities Noted: No Callus: No Atrophie Blanche: No Crepitus: No Cyanosis: No Excoriation: No Ecchymosis: No Fluctuance: No Erythema: No Friable: No Hemosiderin Staining: No Induration: No Mottled: No Localized Edema: Yes Pallor: No Rash: No Rubor: No Scarring: No Temperature / Pain Moisture Temperature: No Abnormality No Abnormalities Noted: No Dry / Scaly: Yes Maceration: No Moist: No Wound Preparation Ulcer Cleansing: Rinsed/Irrigated with Saline Topical Anesthetic Applied: Other: lidocaine 4%, Treatment Notes Wound #1 (Right, Medial Lower Leg) 1. Cleansed with: Clean wound with Normal Saline 4. Dressing Applied: Aquacel Ag 5. Secondary Dressing Applied Gauze and Kerlix/Conform 7. Secured with Tape Other (specify in notes) Notes Ace wrap Electronic  Signature(s) Signed: 04/01/2015 8:13:14 AM By: Regan Lemming BSN, RN Entered By: Regan Lemming on 04/01/2015 08:13:14 AESON, SAWYERS ([id number], Broadus John ([id number]) -------------------------------------------------------------------------------- Vitals Details Patient Name: [name] Date of Service: 04/01/2015 8:00 AM Medical Record Number: [id number] Patient Account Number: [id number] Date of Birth/Sex: 19-Jun-1946 (69 y.o. Male) Treating RN: Afful, RN, BSN, [place] Primary Care Physician: Derinda Late Other Clinician: Referring Physician: BABAOFF, MARCUS Treating Physician/Extender: Frann Rider in Treatment: 3 Vital Signs Time Taken: 08:10 Temperature (F): 98.1 Height (in): 70 Pulse (bpm): 57 Weight (lbs): 168 Respiratory Rate (breaths/min): 16 Body Mass Index (BMI): 24.1 Blood Pressure (mmHg): 107/57 Reference Range: 80 - 120 mg / dl Electronic Signature(s) Signed: 04/01/2015 8:11:15 AM By: Regan Lemming BSN, RN Entered By: Regan Lemming on 04/01/2015 08:11:15

## 2015-04-08 ENCOUNTER — Encounter (HOSPITAL_BASED_OUTPATIENT_CLINIC_OR_DEPARTMENT_OTHER): Payer: Medicare Other | Admitting: General Surgery

## 2015-04-08 ENCOUNTER — Encounter: Payer: Self-pay | Admitting: General Surgery

## 2015-04-08 DIAGNOSIS — I83013 Varicose veins of right lower extremity with ulcer of ankle: Secondary | ICD-10-CM | POA: Diagnosis not present

## 2015-04-08 DIAGNOSIS — I83011 Varicose veins of right lower extremity with ulcer of thigh: Secondary | ICD-10-CM | POA: Diagnosis not present

## 2015-04-08 DIAGNOSIS — I83019 Varicose veins of right lower extremity with ulcer of unspecified site: Secondary | ICD-10-CM

## 2015-04-08 DIAGNOSIS — S81811A Laceration without foreign body, right lower leg, initial encounter: Secondary | ICD-10-CM | POA: Diagnosis not present

## 2015-04-08 DIAGNOSIS — I83015 Varicose veins of right lower extremity with ulcer other part of foot: Secondary | ICD-10-CM

## 2015-04-08 DIAGNOSIS — I83014 Varicose veins of right lower extremity with ulcer of heel and midfoot: Secondary | ICD-10-CM | POA: Diagnosis not present

## 2015-04-08 DIAGNOSIS — L97919 Non-pressure chronic ulcer of unspecified part of right lower leg with unspecified severity: Principal | ICD-10-CM

## 2015-04-08 DIAGNOSIS — I83012 Varicose veins of right lower extremity with ulcer of calf: Secondary | ICD-10-CM

## 2015-04-08 DIAGNOSIS — I83018 Varicose veins of right lower extremity with ulcer other part of lower leg: Secondary | ICD-10-CM

## 2015-04-08 NOTE — Progress Notes (Signed)
See i heal 

## 2015-04-09 NOTE — Progress Notes (Signed)
TREYVONNE, TATA ([id number]) Visit Report for 04/08/2015 Arrival Information Details Patient Name: [name], [name] Date of Service: 04/08/2015 9:30 AM Medical Record Number: [id number] Patient Account Number: [id number] Date of Birth/Sex: 1946-08-18 (69 y.o. Male) Treating RN: Baruch Gouty, RN, BSN, Velva Harman Primary Care Physician: Derinda Late Other Clinician: Referring Physician: BABAOFF, MARCUS Treating Physician/Extender: Benjaman Pott in Treatment: 4 Visit Information History Since Last Visit Any new allergies or adverse reactions: No Patient Arrived: Ambulatory Had a fall or experienced change in No Arrival Time: 09:32 activities of daily living that may affect Accompanied By: dtr risk of falls: Transfer Assistance: None Signs or symptoms of abuse/neglect since last No Patient Identification Verified: Yes visito Secondary Verification Process Yes Has Dressing in Place as Prescribed: Yes Completed: Pain Present Now: No Patient Requires Transmission-Based No Precautions: Patient Has Alerts: Yes Electronic Signature(s) Signed: 04/08/2015 10:21:35 AM By: Judene Companion MD Entered By: Judene Companion on 04/08/2015 10:21:34 [name] ([id number]) -------------------------------------------------------------------------------- Encounter Discharge Information Details Patient Name: [name] Date of Service: 04/08/2015 9:30 AM Medical Record Number: [id number] Patient Account Number: [id number] Date of Birth/Sex: Jul 23, 1946 (69 y.o. Male) Treating RN: Baruch Gouty, RN, BSN, Velva Harman Primary Care Physician: Derinda Late Other Clinician: Referring Physician: BABAOFF, MARCUS Treating Physician/Extender: Benjaman Pott in Treatment: 4 Encounter Discharge Information Items Discharge Pain Level: 0 Discharge Condition: Stable Ambulatory Status: Ambulatory Discharge Destination: Home Private Transportation: Auto Accompanied By: DTR Schedule Follow-up Appointment:  No Medication Reconciliation completed and No provided to Patient/Care Glenda Spelman: Clinical Summary of Care: Electronic Signature(s) Signed: 04/08/2015 10:25:15 AM By: Judene Companion MD Entered By: Judene Companion on 04/08/2015 10:25:15 [name] ([id number]) -------------------------------------------------------------------------------- Lower Extremity Assessment Details Patient Name: [name] Date of Service: 04/08/2015 9:30 AM Medical Record Number: [id number] Patient Account Number: [id number] Date of Birth/Sex: 02/23/1946 (69 y.o. Male) Treating RN: Baruch Gouty, RN, BSN, Velva Harman Primary Care Physician: BABAOFF, MARCUS Other Clinician: Referring Physician: BABAOFF, MARCUS Treating Physician/Extender: Frann Rider in Treatment: 4 Edema Assessment Assessed: [Left: No] [Right: No] E[Left: dema] [Right: :] Calf Left: Right: Point of Measurement: 35 cm From Medial Instep cm 35.8 cm Ankle Left: Right: Point of Measurement: 11 cm From Medial Instep cm 26.4 cm Vascular Assessment Claudication: Claudication Assessment [Right:None] Pulses: Posterior Tibial Dorsalis Pedis Palpable: [Right:Yes] Extremity colors, hair growth, and conditions: Extremity Color: [Right:Hyperpigmented] Hair Growth on Extremity: [Right:No] Temperature of Extremity: [Right:Warm] Capillary Refill: [Right:< 3 seconds] Dependent Rubor: [Right:No] Blanched when Elevated: [Right:No] Lipodermatosclerosis: [Right:No] Electronic Signature(s) Signed: 04/08/2015 5:24:15 PM By: Regan Lemming BSN, RN Entered By: Regan Lemming on 04/08/2015 09:42:52 [name] ([id number]) -------------------------------------------------------------------------------- Multi Wound Chart Details Patient Name: [name] Date of Service: 04/08/2015 9:30 AM Medical Record Number: [id number] Patient Account Number: [id number] Date of Birth/Sex: [birth year] (69 y.o. Male) Treating RN: Baruch Gouty, RN, BSN, [place] Primary Care  Physician: Derinda Late Other Clinician: Referring Physician: BABAOFF, MARCUS Treating Physician/Extender: Judene Companion Weeks in Treatment: 4 Vital Signs Height(in): 70 Pulse(bpm): 62 Weight(lbs): 168 Blood Pressure 122/66 (mmHg): Body Mass Index(BMI): 24 Temperature(F): 97.9 Respiratory Rate 16 (breaths/min): Photos: [1:No Photos] [N/A:N/A] Wound Location: [1:Right Lower Leg - Medial] [N/A:N/A] Wounding Event: [1:Trauma] [N/A:N/A] Primary Etiology: [1:Trauma, Other] [N/A:N/A] Comorbid History: [1:Cirrhosis , Hepatitis C] [N/A:N/A] Date Acquired: [1:02/26/2015] [N/A:N/A] Weeks of Treatment: [1:4] [N/A:N/A] Wound Status: [1:Open] [N/A:N/A] Measurements L x W x D 5x2.5x0.1 [N/A:N/A] (cm) Area (cm) : [1:9.817] [N/A:N/A] Volume (cm) : [1:0.982] [N/A:N/A] % Reduction in Area: [1:51.90%] [N/A:N/A] % Reduction in Volume: 51.90% [N/A:N/A] Classification: [1:Full Thickness Without Exposed Support Structures] [N/A:N/A] Exudate Amount: [1:Small] [N/A:N/A] Exudate  Type: [1:Serous] [N/A:N/A] Exudate Color: [1:amber] [N/A:N/A] Wound Margin: [1:Flat and Intact] [N/A:N/A] Granulation Amount: [1:Small (1-33%)] [N/A:N/A] Granulation Quality: [1:Red] [N/A:N/A] Necrotic Amount: [1:Small (1-33%)] [N/A:N/A] Necrotic Tissue: [1:Eschar] [N/A:N/A] Exposed Structures: [1:Fascia: No Fat: No Tendon: No Muscle: No Joint: No] [N/A:N/A] Bone: No Limited to Skin Breakdown Epithelialization: Medium (34-66%) N/A N/A Debridement: Debridement (57846- N/A N/A 11047) Time-Out Taken: Yes N/A N/A Pain Control: Lidocaine 4% Topical N/A N/A Solution Tissue Debrided: Necrotic/Eschar, N/A N/A Fibrin/Slough, Exudates, Subcutaneous Level: Skin/Subcutaneous N/A N/A Tissue Debridement Area (sq 12.5 N/A N/A cm): Instrument: Curette, Forceps, Scissors N/A N/A Bleeding: Moderate N/A N/A Hemostasis Achieved: Pressure N/A N/A Procedural Pain: 0 N/A N/A Post Procedural Pain: 0 N/A N/A Debridement  Treatment Procedure was tolerated N/A N/A Response: well Post Debridement 5x2.5x1 N/A N/A Measurements L x W x D (cm) Post Debridement 9.817 N/A N/A Volume: (cm) Periwound Skin Texture: Edema: Yes N/A N/A Excoriation: No Induration: No Callus: No Crepitus: No Fluctuance: No Friable: No Rash: No Scarring: No Periwound Skin Moist: Yes N/A N/A Moisture: Dry/Scaly: Yes Maceration: No Periwound Skin Color: Atrophie Blanche: No N/A N/A Cyanosis: No Ecchymosis: No Erythema: No Hemosiderin Staining: No Mottled: No Pallor: No Rubor: No Temperature: No Abnormality N/A N/A No N/A N/A Alcoser, Braydin ([id number]) Tenderness on Palpation: Wound Preparation: Ulcer Cleansing: N/A N/A Rinsed/Irrigated with Saline Topical Anesthetic Applied: Other: lidocaine 4% Procedures Performed: Debridement N/A N/A Treatment Notes Wound #1 (Right, Medial Lower Leg) 1. Cleansed with: Clean wound with Normal Saline 2. Anesthetic 2% Lidocaine injectible with epinephrine prior to debridement 4. Dressing Applied: Aquacel Ag 5. Secondary Dressing Applied Gauze and Kerlix/Conform 7. Secured with Tape Patient to wear own compression stockings Notes juxtalite applied Electronic Signature(s) Signed: 04/08/2015 5:24:15 PM By: Regan Lemming BSN, RN Entered By: Regan Lemming on 04/08/2015 11:47:16 [name] ([id number]) -------------------------------------------------------------------------------- [place] Details Patient Name: [name] Date of Service: 04/08/2015 9:30 AM Medical Record Number: [id number] Patient Account Number: [id number] Date of Birth/Sex: May 25, 1946 (69 y.o. Male) Treating RN: Baruch Gouty, RN, BSN, Velva Harman Primary Care Physician: Derinda Late Other Clinician: Referring Physician: BABAOFF, MARCUS Treating Physician/Extender: Benjaman Pott in Treatment: 4 Active Inactive Orientation to the Wound Care Program Nursing  Diagnoses: Knowledge deficit related to the wound healing center program Goals: Patient/caregiver will verbalize understanding of the [facility] Date Initiated: 03/08/2015 Goal Status: Active Interventions: Provide education on orientation to the wound center Notes: Wound/Skin Impairment Nursing Diagnoses: Impaired tissue integrity Goals: Ulcer/skin breakdown will have a volume reduction of 30% by week 4 Date Initiated: 03/08/2015 Goal Status: Active Interventions: Assess ulceration(s) every visit Notes: Electronic Signature(s) Signed: 04/08/2015 5:24:15 PM By: Regan Lemming BSN, RN Entered By: Regan Lemming on 04/08/2015 11:46:54 [name] ([id number]) -------------------------------------------------------------------------------- Pain Assessment Details Patient Name: [name] Date of Service: 04/08/2015 9:30 AM Medical Record Number: [id number] Patient Account Number: [id number] Date of Birth/Sex: [birth year] (69 y.o. Male) Treating RN: Baruch Gouty, RN, BSN, [place] Primary Care Physician: Derinda Late Other Clinician: Referring Physician: BABAOFF, MARCUS Treating Physician/Extender: Frann Rider in Treatment: 4 Active Problems Location of Pain Severity and Description of Pain Patient Has Paino No Site Locations Pain Management and Medication Current Pain Management: Electronic Signature(s) Signed: 04/08/2015 5:24:15 PM By: Regan Lemming BSN, RN Entered By: Regan Lemming on 04/08/2015 09:35:56 [name] ([id number]) -------------------------------------------------------------------------------- Patient/Caregiver Education Details Patient Name: [name] Date of Service: 04/08/2015 9:30 AM Medical Record Number: [id number] Patient Account Number: [id number] Date of Birth/Gender: 05/18/1946 (69 y.o. Male) Treating RN: Baruch Gouty, RN, BSN, [employer]  Primary Care Physician: BABAOFF, MARCUS Other Clinician: Referring Physician: BABAOFF,  MARCUS Treating Physician/Extender: Frann Rider in Treatment: 4 Education Assessment Education Provided To: Patient Education Topics Provided Basic Hygiene: Methods: Explain/Verbal Welcome To [facility]: Methods: Explain/Verbal Responses: State content correctly Electronic Signature(s) Signed: 04/08/2015 10:25:21 AM By: Judene Companion MD Entered By: Judene Companion on 04/08/2015 10:25:21 [name] ([id number]) -------------------------------------------------------------------------------- Wound Assessment Details Patient Name: [name] Date of Service: 04/08/2015 9:30 AM Medical Record Number: [id number] Patient Account Number: [id number] Date of Birth/Sex: September 18, 1945 (69 y.o. Male) Treating RN: Baruch Gouty, RN, BSN, [place] Primary Care Physician: Derinda Late Other Clinician: Referring Physician: BABAOFF, MARCUS Treating Physician/Extender: Frann Rider in Treatment: 4 Wound Status Wound Number: 1 Primary Etiology: Trauma, Other Wound Location: Right Lower Leg - Medial Wound Status: Open Wounding Event: Trauma Comorbid History: Cirrhosis , Hepatitis C Date Acquired: 02/26/2015 Weeks Of Treatment: 4 Clustered Wound: No Photos Photo Uploaded By: Regan Lemming on 04/08/2015 17:15:12 Wound Measurements Length: (cm) 5 Width: (cm) 2.5 Depth: (cm) 0.1 Area: (cm) 9.817 Volume: (cm) 0.982 % Reduction in Area: 51.9% % Reduction in Volume: 51.9% Epithelialization: Medium (34-66%) Tunneling: No Undermining: No Wound Description Full Thickness Without Exposed Foul Odor Aft Classification: Support Structures Wound Margin: Flat and Intact Exudate Small Amount: Exudate Type: Serous Exudate Color: amber er Cleansing: No Wound Bed Granulation Amount: Small (1-33%) Exposed Structure Granulation Quality: Red Fascia Exposed: No Necrotic Amount: Small (1-33%) Fat Layer Exposed: No Radler, Jasman ([id number]) Necrotic Quality:  Eschar Tendon Exposed: No Muscle Exposed: No Joint Exposed: No Bone Exposed: No Limited to Skin Breakdown Periwound Skin Texture Texture Color No Abnormalities Noted: No No Abnormalities Noted: No Callus: No Atrophie Blanche: No Crepitus: No Cyanosis: No Excoriation: No Ecchymosis: No Fluctuance: No Erythema: No Friable: No Hemosiderin Staining: No Induration: No Mottled: No Localized Edema: Yes Pallor: No Rash: No Rubor: No Scarring: No Temperature / Pain Moisture Temperature: No Abnormality No Abnormalities Noted: No Dry / Scaly: Yes Maceration: No Moist: Yes Wound Preparation Ulcer Cleansing: Rinsed/Irrigated with Saline Topical Anesthetic Applied: Other: lidocaine 4%, Treatment Notes Wound #1 (Right, Medial Lower Leg) 1. Cleansed with: Clean wound with Normal Saline 2. Anesthetic 2% Lidocaine injectible with epinephrine prior to debridement 4. Dressing Applied: Aquacel Ag 5. Secondary Dressing Applied Gauze and Kerlix/Conform 7. Secured with Tape Patient to wear own compression stockings Notes juxtalite applied Electronic Signature(s) Signed: 04/08/2015 5:24:15 PM By: Regan Lemming BSN, RN [place], [place] ([id number]) Entered By: Regan Lemming on 04/08/2015 09:43:39 [name] ([id number]) -------------------------------------------------------------------------------- Vitals Details Patient Name: [name] Date of Service: 04/08/2015 9:30 AM Medical Record Number: [id number] Patient Account Number: [id number] Date of Birth/Sex: [birth year] (69 y.o. Male) Treating RN: Afful, RN, BSN, [place] Primary Care Physician: Derinda Late Other Clinician: Referring Physician: BABAOFF, MARCUS Treating Physician/Extender: Frann Rider in Treatment: 4 Vital Signs Time Taken: 09:35 Temperature (F): 97.9 Height (in): 70 Pulse (bpm): 62 Weight (lbs): 168 Respiratory Rate (breaths/min): 16 Body Mass Index (BMI): 24.1 Blood Pressure (mmHg):  122/66 Reference Range: 80 - 120 mg / dl Electronic Signature(s) Signed: 04/08/2015 5:24:15 PM By: Regan Lemming BSN, RN Entered By: Regan Lemming on 04/08/2015 09:36:50
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
## 2015-04-15 ENCOUNTER — Encounter: Payer: Medicare Other | Attending: Surgery | Admitting: Surgery

## 2015-04-15 DIAGNOSIS — S81811A Laceration without foreign body, right lower leg, initial encounter: Secondary | ICD-10-CM | POA: Diagnosis not present

## 2015-04-15 DIAGNOSIS — K7460 Unspecified cirrhosis of liver: Secondary | ICD-10-CM | POA: Diagnosis not present

## 2015-04-15 DIAGNOSIS — F17218 Nicotine dependence, cigarettes, with other nicotine-induced disorders: Secondary | ICD-10-CM | POA: Diagnosis not present

## 2015-04-15 DIAGNOSIS — L03115 Cellulitis of right lower limb: Secondary | ICD-10-CM | POA: Insufficient documentation

## 2015-04-15 DIAGNOSIS — X58XXXA Exposure to other specified factors, initial encounter: Secondary | ICD-10-CM | POA: Diagnosis not present
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
## 2015-04-15 NOTE — Progress Notes (Signed)
[name], [name] ([id number]) Visit Report for 04/15/2015 Arrival Information Details Patient Name: [name], [name] Date of Service: 04/15/2015 8:15 AM Medical Record Number: [id number] Patient Account Number: [id number] Date of Birth/Sex: [birth year] (69 y.o. Male) Treating RN: Baruch Gouty, RN, BSN, Velva Harman Primary Care Physician: Derinda Late Other Clinician: Referring Physician: BABAOFF, MARCUS Treating Physician/Extender: Frann Rider in Treatment: 5 Visit Information History Since Last Visit Any new allergies or adverse reactions: No Patient Arrived: Ambulatory Had a fall or experienced change in No Arrival Time: 08:15 activities of daily living that may affect Accompanied By: self risk of falls: Transfer Assistance: None Signs or symptoms of abuse/neglect since last No Patient Identification Verified: Yes visito Secondary Verification Process Yes Hospitalized since last visit: No Completed: Has Dressing in Place as Prescribed: Yes Patient Requires Transmission-Based No Has Compression in Place as Prescribed: Yes Precautions: Pain Present Now: No Patient Has Alerts: Yes Electronic Signature(s) Signed: 04/15/2015 3:16:53 PM By: Regan Lemming BSN, RN Entered By: Regan Lemming on 04/15/2015 08:23:19 [name] ([id number]) -------------------------------------------------------------------------------- Encounter Discharge Information Details Patient Name: [name] Date of Service: 04/15/2015 8:15 AM Medical Record Number: [id number] Patient Account Number: [id number] Date of Birth/Sex: 1946-06-08 (69 y.o. Male) Treating RN: Baruch Gouty, RN, BSN, Velva Harman Primary Care Physician: Derinda Late Other Clinician: Referring Physician: BABAOFF, MARCUS Treating Physician/Extender: Frann Rider in Treatment: 5 Encounter Discharge Information Items Discharge Pain Level: 0 Discharge Condition: Stable Ambulatory Status: Ambulatory Discharge Destination:  Home Private Transportation: Auto Accompanied By: self Schedule Follow-up Appointment: No Medication Reconciliation completed and No provided to Patient/Care [name]: Clinical Summary of Care: Electronic Signature(s) Signed: 04/15/2015 3:16:53 PM By: Regan Lemming BSN, RN Entered By: Regan Lemming on 04/15/2015 08:42:59 [name] ([id number]) -------------------------------------------------------------------------------- Lower Extremity Assessment Details Patient Name: [name] Date of Service: 04/15/2015 8:15 AM Medical Record Number: [id number] Patient Account Number: [id number] Date of Birth/Sex: [birth year] (69 y.o. Male) Treating RN: Baruch Gouty, RN, BSN, Velva Harman Primary Care Physician: BABAOFF, MARCUS Other Clinician: Referring Physician: BABAOFF, MARCUS Treating Physician/Extender: Frann Rider in Treatment: 5 Edema Assessment Assessed: [Left: No] [Right: No] E[Left: dema] [Right: :] Calf Left: Right: Point of Measurement: 35 cm From Medial Instep cm 35.1 cm Ankle Left: Right: Point of Measurement: 11 cm From Medial Instep cm 25.6 cm Vascular Assessment Claudication: Claudication Assessment [Right:None] Pulses: Posterior Tibial Dorsalis Pedis Palpable: [Right:Yes] Extremity colors, hair growth, and conditions: Extremity Color: [Right:Mottled] Hair Growth on Extremity: [Right:No] Temperature of Extremity: [Right:Warm] Capillary Refill: [Right:< 3 seconds] Electronic Signature(s) Signed: 04/15/2015 3:16:53 PM By: Regan Lemming BSN, RN Entered By: Regan Lemming on 04/15/2015 08:26:00 [name] ([id number]) -------------------------------------------------------------------------------- Multi Wound Chart Details Patient Name: [name] Date of Service: 04/15/2015 8:15 AM Medical Record Number: [id number] Patient Account Number: [id number] Date of Birth/Sex: [birth year] (69 y.o. Male) Treating RN: Baruch Gouty, RN, BSN, [place] Primary Care Physician:  Derinda Late Other Clinician: Referring Physician: BABAOFF, MARCUS Treating Physician/Extender: Frann Rider in Treatment: 5 Vital Signs Height(in): 70 Pulse(bpm): 66 Weight(lbs): 168 Blood Pressure 115/60 (mmHg): Body Mass Index(BMI): 24 Temperature(F): 98.2 Respiratory Rate 16 (breaths/min): Photos: [1:No Photos] [N/A:N/A] Wound Location: [1:Right Lower Leg - Medial] [N/A:N/A] Wounding Event: [1:Trauma] [N/A:N/A] Primary Etiology: [1:Trauma, Other] [N/A:N/A] Comorbid History: [1:Cirrhosis , Hepatitis C] [N/A:N/A] Date Acquired: [1:02/26/2015] [N/A:N/A] Weeks of Treatment: [1:5] [N/A:N/A] Wound Status: [1:Open] [N/A:N/A] Measurements L x W x D 5x2.5x0.4 [N/A:N/A] (cm) Area (cm) : [1:9.817] [N/A:N/A] Volume (cm) : [1:3.927] [N/A:N/A] % Reduction in Area: [1:51.90%] [N/A:N/A] % Reduction in Volume: -92.30% [N/A:N/A] Classification: [1:Full Thickness Without Exposed Support Structures] [N/A:N/A]  Exudate Amount: [1:Small] [N/A:N/A] Exudate Type: [1:Serous] [N/A:N/A] Exudate Color: [1:amber] [N/A:N/A] Wound Margin: [1:Flat and Intact] [N/A:N/A] Granulation Amount: [1:Small (1-33%)] [N/A:N/A] Granulation Quality: [1:Red] [N/A:N/A] Necrotic Amount: [1:Small (1-33%)] [N/A:N/A] Necrotic Tissue: [1:Eschar] [N/A:N/A] Exposed Structures: [1:Fascia: No Fat: No Tendon: No Muscle: No Joint: No] [N/A:N/A] Bone: No Limited to Skin Breakdown Epithelialization: Medium (34-66%) N/A N/A Periwound Skin Texture: Edema: Yes N/A N/A Excoriation: No Induration: No Callus: No Crepitus: No Fluctuance: No Friable: No Rash: No Scarring: No Periwound Skin Moist: Yes N/A N/A Moisture: Dry/Scaly: Yes Maceration: No Periwound Skin Color: Atrophie Blanche: No N/A N/A Cyanosis: No Ecchymosis: No Erythema: No Hemosiderin Staining: No Mottled: No Pallor: No Rubor: No Temperature: No Abnormality N/A N/A Tenderness on No N/A N/A Palpation: Wound Preparation: Ulcer  Cleansing: N/A N/A Rinsed/Irrigated with Saline Topical Anesthetic Applied: Other: lidocaine 4% Treatment Notes Electronic Signature(s) Signed: 04/15/2015 3:16:53 PM By: Regan Lemming BSN, RN Entered By: Regan Lemming on 04/15/2015 08:30:01 [name] ([id number]) -------------------------------------------------------------------------------- Multi-Disciplinary Care Plan Details Patient Name: [name] Date of Service: 04/15/2015 8:15 AM Medical Record Number: [id number] Patient Account Number: [id number] Date of Birth/Sex: 03-13-1946 (69 y.o. Male) Treating RN: Baruch Gouty, RN, BSN, Velva Harman Primary Care Physician: Derinda Late Other Clinician: Referring Physician: BABAOFF, MARCUS Treating Physician/Extender: Frann Rider in Treatment: 5 Active Inactive Orientation to the Wound Care Program Nursing Diagnoses: Knowledge deficit related to the wound healing center program Goals: Patient/caregiver will verbalize understanding of the [place] Program Date Initiated: 03/08/2015 Goal Status: Active Interventions: Provide education on orientation to the wound center Notes: Wound/Skin Impairment Nursing Diagnoses: Impaired tissue integrity Goals: Ulcer/skin breakdown will have a volume reduction of 30% by week 4 Date Initiated: 03/08/2015 Goal Status: Active Interventions: Assess ulceration(s) every visit Notes: Electronic Signature(s) Signed: 04/15/2015 3:16:53 PM By: Regan Lemming BSN, RN Entered By: Regan Lemming on 04/15/2015 08:29:11 [name] ([id number]) -------------------------------------------------------------------------------- Pain Assessment Details Patient Name: [name] Date of Service: 04/15/2015 8:15 AM Medical Record Number: [id number] Patient Account Number: [id number] Date of Birth/Sex: [birth year] (69 y.o. Male) Treating RN: Baruch Gouty, RN, BSN, Velva Harman Primary Care Physician: Derinda Late Other Clinician: Referring Physician:  BABAOFF, MARCUS Treating Physician/Extender: Frann Rider in Treatment: 5 Active Problems Location of Pain Severity and Description of Pain Patient Has Paino No Site Locations Pain Management and Medication Current Pain Management: Electronic Signature(s) Signed: 04/15/2015 3:16:53 PM By: Regan Lemming BSN, RN Entered By: Regan Lemming on 04/15/2015 08:24:22 [name] ([id number]) -------------------------------------------------------------------------------- Patient/Caregiver Education Details Patient Name: [name] Date of Service: 04/15/2015 8:15 AM Medical Record Number: [id number] Patient Account Number: [id number] Date of Birth/Gender: 12/19/1945 (69 y.o. Male) Treating RN: Baruch Gouty, RN, BSN, Velva Harman Primary Care Physician: Derinda Late Other Clinician: Referring Physician: BABAOFF, MARCUS Treating Physician/Extender: Frann Rider in Treatment: 5 Education Assessment Education Provided To: Patient Education Topics Provided Welcome To [facility]: Methods: Explain/Verbal Responses: State content correctly Electronic Signature(s) Signed: 04/15/2015 3:16:53 PM By: Regan Lemming BSN, RN Entered By: Regan Lemming on 04/15/2015 08:43:08 [name] ([id number]) -------------------------------------------------------------------------------- Wound Assessment Details Patient Name: [name] Date of Service: 04/15/2015 8:15 AM Medical Record Number: [id number] Patient Account Number: [id number] Date of Birth/Sex: Mar 11, 1946 (69 y.o. Male) Treating RN: Baruch Gouty, RN, BSN, Velva Harman Primary Care Physician: Derinda Late Other Clinician: Referring Physician: BABAOFF, MARCUS Treating Physician/Extender: Frann Rider in Treatment: 5 Wound Status Wound Number: 1 Primary Etiology: Trauma, Other Wound Location: Right Lower Leg - Medial Wound Status: Open Wounding Event: Trauma Comorbid History: Cirrhosis , Hepatitis C Date Acquired:  02/26/2015 Weeks Of Treatment: 5 Clustered Wound: No Photos Photo Uploaded By: Regan Lemming on 04/15/2015 15:07:03 Wound Measurements Length: (cm) 5 Width: (cm) 2.5 Depth: (cm) 0.4 Area: (cm) 9.817 Volume: (cm) 3.927 % Reduction in Area: 51.9% % Reduction in Volume: -92.3% Epithelialization: Medium (34-66%) Tunneling: No Undermining: No Wound Description Full Thickness Without Exposed Foul Odor Aft Classification: Support Structures Wound Margin: Flat and Intact Exudate Small Amount: Exudate Type: Serous Exudate Color: amber er Cleansing: No Wound Bed Granulation Amount: Small (1-33%) Exposed Structure Granulation Quality: Red Fascia Exposed: No Necrotic Amount: Small (1-33%) Fat Layer Exposed: No Littman, Maceo ([id number]) Necrotic Quality: Eschar Tendon Exposed: No Muscle Exposed: No Joint Exposed: No Bone Exposed: No Limited to Skin Breakdown Periwound Skin Texture Texture Color No Abnormalities Noted: No No Abnormalities Noted: No Callus: No Atrophie Blanche: No Crepitus: No Cyanosis: No Excoriation: No Ecchymosis: No Fluctuance: No Erythema: No Friable: No Hemosiderin Staining: No Induration: No Mottled: No Localized Edema: Yes Pallor: No Rash: No Rubor: No Scarring: No Temperature / Pain Moisture Temperature: No Abnormality No Abnormalities Noted: No Dry / Scaly: Yes Maceration: No Moist: Yes Wound Preparation Ulcer Cleansing: Rinsed/Irrigated with Saline Topical Anesthetic Applied: Other: lidocaine 4%, Treatment Notes Wound #1 (Right, Medial Lower Leg) 1. Cleansed with: Clean wound with Normal Saline 2. Anesthetic Topical Lidocaine 4% cream to wound bed prior to debridement 4. Dressing Applied: Aquacel Ag 5. Secondary Dressing Applied Bordered Foam Dressing Notes juxtalite applied Electronic Signature(s) Signed: 04/15/2015 3:16:53 PM By: Regan Lemming BSN, RN Entered By: Regan Lemming on 04/15/2015 08:26:56 [name]  ([id number]) -------------------------------------------------------------------------------- [place] Details Patient Name: [name] Date of Service: 04/15/2015 8:15 AM Medical Record Number: [id number] Patient Account Number: [id number] Date of Birth/Sex: 12/18/1945 (69 y.o. Male) Treating RN: Afful, RN, BSN, [place] Primary Care Physician: BABAOFF, MARCUS Other Clinician: Referring Physician: BABAOFF, MARCUS Treating Physician/Extender: Frann Rider in Treatment: 5 Vital Signs Time Taken: 08:20 Temperature (F): 98.2 Height (in): 70 Pulse (bpm): 66 Weight (lbs): 168 Respiratory Rate (breaths/min): 16 Body Mass Index (BMI): 24.1 Blood Pressure (mmHg): 115/60 Reference Range: 80 - 120 mg / dl Electronic Signature(s) Signed: 04/15/2015 3:16:53 PM By: Regan Lemming BSN, RN Entered By: Regan Lemming on 04/15/2015 08:24:41

## 2015-04-22 ENCOUNTER — Encounter: Payer: Medicare Other | Admitting: Surgery

## 2015-04-22 DIAGNOSIS — S81811A Laceration without foreign body, right lower leg, initial encounter: Secondary | ICD-10-CM | POA: Diagnosis not present
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
## 2015-04-23 NOTE — Progress Notes (Signed)
[name], [name] ([id number]) Visit Report for 04/22/2015 Arrival Information Details Patient Name: [name], [name] Date of Service: 04/22/2015 8:15 AM Medical Record Number: [id number] Patient Account Number: [id number] Date of Birth/Sex: [birth year] (69 y.o. Male) Treating RN: Montey Hora Primary Care Physician: Derinda Late Other Clinician: Referring Physician: BABAOFF, MARCUS Treating Physician/Extender: Frann Rider in Treatment: 6 Visit Information History Since Last Visit Added or deleted any medications: No Patient Arrived: Ambulatory Any new allergies or adverse reactions: No Arrival Time: 08:22 Had a fall or experienced change in No Accompanied By: self activities of daily living that may affect Transfer Assistance: None risk of falls: Patient Identification Verified: Yes Signs or symptoms of abuse/neglect since last No Secondary Verification Process Yes visito Completed: Hospitalized since last visit: No Patient Requires Transmission-Based No Pain Present Now: No Precautions: Patient Has Alerts: Yes Electronic Signature(s) Signed: 04/22/2015 5:44:32 PM By: Montey Hora Entered By: Montey Hora on 04/22/2015 08:22:18 [name] ([id number]) -------------------------------------------------------------------------------- Encounter Discharge Information Details Patient Name: [name] Date of Service: 04/22/2015 8:15 AM Medical Record Number: [id number] Patient Account Number: [id number] Date of Birth/Sex: May 08, 1946 (69 y.o. Male) Treating RN: Montey Hora Primary Care Physician: BABAOFF, MARCUS Other Clinician: Referring Physician: BABAOFF, MARCUS Treating Physician/Extender: Frann Rider in Treatment: 6 Encounter Discharge Information Items Discharge Pain Level: 0 Discharge Condition: Stable Ambulatory Status: Ambulatory Discharge Destination: Home Transportation: Private Auto Accompanied By: self Schedule Follow-up  Appointment: Yes Medication Reconciliation completed No and provided to Patient/Care Izzie Geers: Patient Clinical Summary of Care: Declined Electronic Signature(s) Signed: 04/22/2015 8:57:19 AM By: Ruthine Dose Entered By: Ruthine Dose on 04/22/2015 08:57:19 [name] ([id number]) -------------------------------------------------------------------------------- Lower Extremity Assessment Details Patient Name: [name] Date of Service: 04/22/2015 8:15 AM Medical Record Number: [id number] Patient Account Number: [id number] Date of Birth/Sex: [birth year] (69 y.o. Male) Treating RN: Montey Hora Primary Care Physician: BABAOFF, MARCUS Other Clinician: Referring Physician: BABAOFF, MARCUS Treating Physician/Extender: Frann Rider in Treatment: 6 Edema Assessment Assessed: [Left: No] [Right: No] E[Left: dema] [Right: :] Calf Left: Right: Point of Measurement: 35 cm From Medial Instep cm 34.5 cm Ankle Left: Right: Point of Measurement: 11 cm From Medial Instep cm 23.6 cm Vascular Assessment Pulses: Posterior Tibial Dorsalis Pedis Palpable: [Right:Yes] Extremity colors, hair growth, and conditions: Extremity Color: [Right:Hyperpigmented] Hair Growth on Extremity: [Right:No] Temperature of Extremity: [Right:Warm] Capillary Refill: [Right:< 3 seconds] Toe Nail Assessment Left: Right: Thick: No Discolored: No Deformed: No Improper Length and Hygiene: No Electronic Signature(s) Signed: 04/22/2015 5:44:32 PM By: Montey Hora Entered By: Montey Hora on 04/22/2015 08:30:38 [name] ([id number]) -------------------------------------------------------------------------------- Multi Wound Chart Details Patient Name: [name] Date of Service: 04/22/2015 8:15 AM Medical Record Number: [id number] Patient Account Number: [id number] Date of Birth/Sex: 1946-08-02 (69 y.o. Male) Treating RN: Montey Hora Primary Care Physician: BABAOFF,  MARCUS Other Clinician: Referring Physician: BABAOFF, MARCUS Treating Physician/Extender: Frann Rider in Treatment: 6 Vital Signs Height(in): 70 Pulse(bpm): 57 Weight(lbs): 168 Blood Pressure 113/59 (mmHg): Body Mass Index(BMI): 24 Temperature(F): 98.3 Respiratory Rate 18 (breaths/min): Photos: [1:No Photos] [N/A:N/A] Wound Location: [1:Right Lower Leg - Medial] [N/A:N/A] Wounding Event: [1:Trauma] [N/A:N/A] Primary Etiology: [1:Trauma, Other] [N/A:N/A] Comorbid History: [1:Cirrhosis , Hepatitis C] [N/A:N/A] Date Acquired: [1:02/26/2015] [N/A:N/A] Weeks of Treatment: [1:6] [N/A:N/A] Wound Status: [1:Open] [N/A:N/A] Measurements L x W x D 5x1.2x0.9 [N/A:N/A] (cm) Area (cm) : [1:4.712] [N/A:N/A] Volume (cm) : [1:4.241] [N/A:N/A] % Reduction in Area: [1:76.90%] [N/A:N/A] % Reduction in Volume: -107.70% [N/A:N/A] Classification: [1:Full Thickness Without Exposed Support Structures] [N/A:N/A] Exudate Amount: [1:Small] [N/A:N/A] Exudate Type: [1:Serous] [N/A:N/A]  Exudate Color: [1:amber] [N/A:N/A] Wound Margin: [1:Flat and Intact] [N/A:N/A] Granulation Amount: [1:Medium (34-66%)] [N/A:N/A] Granulation Quality: [1:Red] [N/A:N/A] Necrotic Amount: [1:Small (1-33%)] [N/A:N/A] Necrotic Tissue: [1:Eschar] [N/A:N/A] Exposed Structures: [1:Fascia: No Fat: No Tendon: No Muscle: No Joint: No] [N/A:N/A] Bone: No Limited to Skin Breakdown Epithelialization: Medium (34-66%) N/A N/A Periwound Skin Texture: Edema: Yes N/A N/A Excoriation: No Induration: No Callus: No Crepitus: No Fluctuance: No Friable: No Rash: No Scarring: No Periwound Skin Moist: Yes N/A N/A Moisture: Dry/Scaly: Yes Maceration: No Periwound Skin Color: Atrophie Blanche: No N/A N/A Cyanosis: No Ecchymosis: No Erythema: No Hemosiderin Staining: No Mottled: No Pallor: No Rubor: No Temperature: No Abnormality N/A N/A Tenderness on No N/A N/A Palpation: Wound Preparation: Ulcer Cleansing:  N/A N/A Rinsed/Irrigated with Saline Topical Anesthetic Applied: Other: lidocaine 4% Treatment Notes Electronic Signature(s) Signed: 04/22/2015 8:36:55 AM By: Montey Hora Entered By: Montey Hora on 04/22/2015 08:36:55 [name] ([id number]) -------------------------------------------------------------------------------- Multi-Disciplinary Care Plan Details Patient Name: [name] Date of Service: 04/22/2015 8:15 AM Medical Record Number: [id number] Patient Account Number: [id number] Date of Birth/Sex: 27-Jan-1946 (69 y.o. Male) Treating RN: Montey Hora Primary Care Physician: BABAOFF, MARCUS Other Clinician: Referring Physician: BABAOFF, MARCUS Treating Physician/Extender: Frann Rider in Treatment: 6 Active Inactive Orientation to the Wound Care Program Nursing Diagnoses: Knowledge deficit related to the wound healing center program Goals: Patient/caregiver will verbalize understanding of the [place] Program Date Initiated: 03/08/2015 Goal Status: Active Interventions: Provide education on orientation to the wound center Notes: Wound/Skin Impairment Nursing Diagnoses: Impaired tissue integrity Goals: Ulcer/skin breakdown will have a volume reduction of 30% by week 4 Date Initiated: 03/08/2015 Goal Status: Active Interventions: Assess ulceration(s) every visit Notes: Electronic Signature(s) Signed: 04/22/2015 8:36:47 AM By: Montey Hora Entered By: Montey Hora on 04/22/2015 08:36:47 [name] ([id number]) -------------------------------------------------------------------------------- Patient/Caregiver Education Details Patient Name: [name] Date of Service: 04/22/2015 8:15 AM Medical Record Number: [id number] Patient Account Number: [id number] Date of Birth/Gender: 12/24/1945 (69 y.o. Male) Treating RN: Montey Hora Primary Care Physician: Derinda Late Other Clinician: Referring Physician: BABAOFF,  MARCUS Treating Physician/Extender: Frann Rider in Treatment: 6 Education Assessment Education Provided To: Patient Education Topics Provided Basic Hygiene: Handouts: Other: skin care including lotion to dry skin and no scratching dry areas to avoid new wounds Methods: Explain/Verbal Responses: State content correctly Electronic Signature(s) Signed: 04/22/2015 8:38:02 AM By: Montey Hora Entered By: Montey Hora on 04/22/2015 08:38:02 [name] ([id number]) -------------------------------------------------------------------------------- Wound Assessment Details Patient Name: [name] Date of Service: 04/22/2015 8:15 AM Medical Record Number: [id number] Patient Account Number: [id number] Date of Birth/Sex: December 24, 1945 (69 y.o. Male) Treating RN: Montey Hora Primary Care Physician: BABAOFF, MARCUS Other Clinician: Referring Physician: BABAOFF, MARCUS Treating Physician/Extender: Frann Rider in Treatment: 6 Wound Status Wound Number: 1 Primary Etiology: Trauma, Other Wound Location: Right Lower Leg - Medial Wound Status: Open Wounding Event: Trauma Comorbid History: Cirrhosis , Hepatitis C Date Acquired: 02/26/2015 Weeks Of Treatment: 6 Clustered Wound: No Photos Photo Uploaded By: Montey Hora on 04/22/2015 17:38:34 Wound Measurements Length: (cm) 5 Width: (cm) 1.2 Depth: (cm) 0.9 Area: (cm) 4.712 Volume: (cm) 4.241 % Reduction in Area: 76.9% % Reduction in Volume: -107.7% Epithelialization: Medium (34-66%) Tunneling: No Undermining: No Wound Description Full Thickness Without Exposed Foul Odor Afte Classification: Support Structures Wound Margin: Flat and Intact Exudate Small Amount: Exudate Type: Serous Exudate Color: amber r Cleansing: No Wound Bed Granulation Amount: Medium (34-66%) Exposed Structure Granulation Quality: Red Fascia Exposed: No Necrotic Amount: Small (1-33%) Fat Layer Exposed: No [place],  Josuel ([id number]) Necrotic Quality: Eschar Tendon Exposed: No Muscle Exposed: No Joint Exposed: No Bone Exposed: No Limited to Skin Breakdown Periwound Skin Texture Texture Color No Abnormalities Noted: No No Abnormalities Noted: No Callus: No Atrophie Blanche: No Crepitus: No Cyanosis: No Excoriation: No Ecchymosis: No Fluctuance: No Erythema: No Friable: No Hemosiderin Staining: No Induration: No Mottled: No Localized Edema: Yes Pallor: No Rash: No Rubor: No Scarring: No Temperature / Pain Moisture Temperature: No Abnormality No Abnormalities Noted: No Dry / Scaly: Yes Maceration: No Moist: Yes Wound Preparation Ulcer Cleansing: Rinsed/Irrigated with Saline Topical Anesthetic Applied: Other: lidocaine 4%, Treatment Notes Wound #1 (Right, Medial Lower Leg) 1. Cleansed with: Clean wound with Normal Saline 2. Anesthetic Topical Lidocaine 4% cream to wound bed prior to debridement 4. Dressing Applied: Aquacel Ag 5. Secondary Dressing Applied Kerlix/Conform Non-Adherent pad 7. Secured with Tape Notes juxtalite applied Electronic Signature(s) Signed: 04/22/2015 5:44:32 PM By: Veva Holes, Broadus John ([id number]) Entered By: Montey Hora on 04/22/2015 08:32:39 [name] ([id number]) -------------------------------------------------------------------------------- [place] Details Patient Name: [name] Date of Service: 04/22/2015 8:15 AM Medical Record Number: [id number] Patient Account Number: [id number] Date of Birth/Sex: 11/08/1945 (69 y.o. Male) Treating RN: Montey Hora Primary Care Physician: BABAOFF, MARCUS Other Clinician: Referring Physician: BABAOFF, MARCUS Treating Physician/Extender: Frann Rider in Treatment: 6 Vital Signs Time Taken: 08:23 Temperature (F): 98.3 Height (in): 70 Pulse (bpm): 57 Weight (lbs): 168 Respiratory Rate (breaths/min): 18 Body Mass Index (BMI): 24.1 Blood Pressure (mmHg):  113/59 Reference Range: 80 - 120 mg / dl Electronic Signature(s) Signed: 04/22/2015 5:44:32 PM By: Montey Hora Entered By: Montey Hora on 04/22/2015 08:23:58

## 2015-04-29 ENCOUNTER — Encounter: Payer: Medicare Other | Admitting: Surgery

## 2015-04-29 DIAGNOSIS — S81811A Laceration without foreign body, right lower leg, initial encounter: Secondary | ICD-10-CM | POA: Diagnosis not present
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
## 2015-04-30 NOTE — Progress Notes (Signed)
Roy, [name] ([id number]) Visit Report for 04/29/2015 Arrival Information Details Patient Name: [name], [name] Date of Service: 04/29/2015 8:15 AM Medical Record Number: [id number] Patient Account Number: [id number] Date of Birth/Sex: 1945/10/17 (69 y.o. Male) Treating RN: Montey Hora Primary Care Physician: Derinda Late Other Clinician: Referring Physician: BABAOFF, MARCUS Treating Physician/Extender: Frann Rider in Treatment: 7 Visit Information History Since Last Visit Added or deleted any medications: No Patient Arrived: Ambulatory Any new allergies or adverse reactions: No Arrival Time: 08:19 Had a fall or experienced change in No Accompanied By: self activities of daily living that may affect Transfer Assistance: None risk of falls: Patient Identification Verified: Yes Signs or symptoms of abuse/neglect since last No Secondary Verification Process Yes visito Completed: Hospitalized since last visit: No Patient Requires Transmission-Based No Pain Present Now: No Precautions: Patient Has Alerts: Yes Electronic Signature(s) Signed: 04/29/2015 4:44:11 PM By: Montey Hora Entered By: Montey Hora on 04/29/2015 08:19:30 [name] ([id number]) -------------------------------------------------------------------------------- Clinic Level of Care Assessment Details Patient Name: [name] Date of Service: 04/29/2015 8:15 AM Medical Record Number: [id number] Patient Account Number: [id number] Date of Birth/Sex: March 17, 1946 (69 y.o. Male) Treating RN: Montey Hora Primary Care Physician: BABAOFF, MARCUS Other Clinician: Referring Physician: BABAOFF, MARCUS Treating Physician/Extender: Frann Rider in Treatment: 7 Clinic Level of Care Assessment Items TOOL 4 Quantity Score '[]'$  - Use when only an EandM is performed on FOLLOW-UP visit 0 ASSESSMENTS - Nursing Assessment / Reassessment X - Reassessment of Co-morbidities (includes  updates in patient status) 1 10 X - Reassessment of Adherence to Treatment Plan 1 5 ASSESSMENTS - Wound and Skin Assessment / Reassessment X - Simple Wound Assessment / Reassessment - one wound 1 5 '[]'$  - Complex Wound Assessment / Reassessment - multiple wounds 0 '[]'$  - Dermatologic / Skin Assessment (not related to wound area) 0 ASSESSMENTS - Focused Assessment X - Circumferential Edema Measurements - multi extremities 1 5 '[]'$  - Nutritional Assessment / Counseling / Intervention 0 X - Lower Extremity Assessment (monofilament, tuning fork, pulses) 1 5 '[]'$  - Peripheral Arterial Disease Assessment (using hand held doppler) 0 ASSESSMENTS - Ostomy and/or Continence Assessment and Care '[]'$  - Incontinence Assessment and Management 0 '[]'$  - Ostomy Care Assessment and Management (repouching, etc.) 0 PROCESS - Coordination of Care X - Simple Patient / Family Education for ongoing care 1 15 '[]'$  - Complex (extensive) Patient / Family Education for ongoing care 0 '[]'$  - Staff obtains Programmer, systems, Records, Test Results / Process Orders 0 '[]'$  - Staff telephones HHA, Nursing Homes / Clarify orders / etc 0 '[]'$  - Routine Transfer to another Facility (non-emergent condition) 0 Malphrus, Avyn ([id number]) '[]'$  - Routine Hospital Admission (non-emergent condition) 0 '[]'$  - New Admissions / Biomedical engineer / Ordering NPWT, Apligraf, etc. 0 '[]'$  - Emergency Hospital Admission (emergent condition) 0 X - Simple Discharge Coordination 1 10 '[]'$  - Complex (extensive) Discharge Coordination 0 PROCESS - Special Needs '[]'$  - Pediatric / Minor Patient Management 0 '[]'$  - Isolation Patient Management 0 '[]'$  - Hearing / Language / Visual special needs 0 '[]'$  - Assessment of Community assistance (transportation, D/C planning, etc.) 0 '[]'$  - Additional assistance / Altered mentation 0 '[]'$  - Support Surface(s) Assessment (bed, cushion, seat, etc.) 0 INTERVENTIONS - Wound Cleansing / Measurement X - Simple Wound Cleansing - one wound 1 5 '[]'$   - Complex Wound Cleansing - multiple wounds 0 X - Wound Imaging (photographs - any number of wounds) 1 5 '[]'$  - Wound Tracing (instead of photographs) 0 X - Simple Wound Measurement -  one wound 1 5 '[]'$  - Complex Wound Measurement - multiple wounds 0 INTERVENTIONS - Wound Dressings X - Small Wound Dressing one or multiple wounds 1 10 '[]'$  - Medium Wound Dressing one or multiple wounds 0 '[]'$  - Large Wound Dressing one or multiple wounds 0 '[]'$  - Application of Medications - topical 0 '[]'$  - Application of Medications - injection 0 INTERVENTIONS - Miscellaneous '[]'$  - External ear exam 0 Rodrigue, Akim ([id number]) '[]'$  - Specimen Collection (cultures, biopsies, blood, body fluids, etc.) 0 '[]'$  - Specimen(s) / Culture(s) sent or taken to Lab for analysis 0 '[]'$  - Patient Transfer (multiple staff / Harrel Lemon Lift / Similar devices) 0 '[]'$  - Simple Staple / Suture removal (25 or less) 0 '[]'$  - Complex Staple / Suture removal (26 or more) 0 '[]'$  - Hypo / Hyperglycemic Management (close monitor of Blood Glucose) 0 '[]'$  - Ankle / Brachial Index (ABI) - do not check if billed separately 0 X - Vital Signs 1 5 Has the patient been seen at the hospital within the last three years: Yes Total Score: 85 Level Of Care: New/Established - Level 3 Electronic Signature(s) Signed: 04/29/2015 4:44:11 PM By: Montey Hora Entered By: Montey Hora on 04/29/2015 08:43:04 [name] ([id number]) -------------------------------------------------------------------------------- Encounter Discharge Information Details Patient Name: [name] Date of Service: 04/29/2015 8:15 AM Medical Record Number: [id number] Patient Account Number: [id number] Date of Birth/Sex: [birth year] (69 y.o. Male) Treating RN: Montey Hora Primary Care Physician: Derinda Late Other Clinician: Referring Physician: BABAOFF, MARCUS Treating Physician/Extender: Frann Rider in Treatment: 7 Encounter Discharge Information Items Discharge  Pain Level: 0 Discharge Condition: Stable Ambulatory Status: Ambulatory Discharge Destination: Home Transportation: Private Auto Accompanied By: self Schedule Follow-up Appointment: Yes Medication Reconciliation completed No and provided to Patient/Care Allean Montfort: Patient Clinical Summary of Care: Declined Electronic Signature(s) Signed: 04/29/2015 8:59:23 AM By: Ruthine Dose Entered By: Ruthine Dose on 04/29/2015 08:59:23 [name] ([id number]) -------------------------------------------------------------------------------- Lower Extremity Assessment Details Patient Name: [name] Date of Service: 04/29/2015 8:15 AM Medical Record Number: [id number] Patient Account Number: [id number] Date of Birth/Sex: 1945-11-18 (69 y.o. Male) Treating RN: Montey Hora Primary Care Physician: BABAOFF, MARCUS Other Clinician: Referring Physician: BABAOFF, MARCUS Treating Physician/Extender: Frann Rider in Treatment: 7 Edema Assessment Assessed: [Left: No] [Right: No] Edema: [Left: Ye] [Right: s] Calf Left: Right: Point of Measurement: 35 cm From Medial Instep cm 34.3 cm Ankle Left: Right: Point of Measurement: 11 cm From Medial Instep cm 24.6 cm Vascular Assessment Pulses: Posterior Tibial Dorsalis Pedis Palpable: [Right:Yes] Extremity colors, hair growth, and conditions: Extremity Color: [Right:Red] Hair Growth on Extremity: [Right:No] Temperature of Extremity: [Right:Warm] Capillary Refill: [Right:< 3 seconds] Toe Nail Assessment Left: Right: Thick: No Discolored: No Deformed: No Improper Length and Hygiene: No Electronic Signature(s) Signed: 04/29/2015 4:44:11 PM By: Montey Hora Entered By: Montey Hora on 04/29/2015 08:24:58 [name] ([id number]) -------------------------------------------------------------------------------- Multi Wound Chart Details Patient Name: [name] Date of Service: 04/29/2015 8:15 AM Medical Record  Number: [id number] Patient Account Number: [id number] Date of Birth/Sex: December 15, 1945 (69 y.o. Male) Treating RN: Montey Hora Primary Care Physician: BABAOFF, MARCUS Other Clinician: Referring Physician: BABAOFF, MARCUS Treating Physician/Extender: Frann Rider in Treatment: 7 Vital Signs Height(in): 70 Pulse(bpm): 53 Weight(lbs): 168 Blood Pressure 104/60 (mmHg): Body Mass Index(BMI): 24 Temperature(F): 98.4 Respiratory Rate 18 (breaths/min): Photos: [1:No Photos] [N/A:N/A] Wound Location: [1:Right Lower Leg - Medial] [N/A:N/A] Wounding Event: [1:Trauma] [N/A:N/A] Primary Etiology: [1:Trauma, Other] [N/A:N/A] Comorbid History: [1:Cirrhosis , Hepatitis C] [N/A:N/A] Date Acquired: [1:02/26/2015] [N/A:N/A] Weeks of Treatment: [1:7] [N/A:N/A] Wound  Status: [1:Open] [N/A:N/A] Measurements L x W x D 1.9x1.2x0.7 [N/A:N/A] (cm) Area (cm) : [1:1.791] [N/A:N/A] Volume (cm) : [1:1.253] [N/A:N/A] % Reduction in Area: [1:91.20%] [N/A:N/A] % Reduction in Volume: 38.60% [N/A:N/A] Classification: [1:Full Thickness Without Exposed Support Structures] [N/A:N/A] Exudate Amount: [1:Small] [N/A:N/A] Exudate Type: [1:Serous] [N/A:N/A] Exudate Color: [1:amber] [N/A:N/A] Wound Margin: [1:Flat and Intact] [N/A:N/A] Granulation Amount: [1:Large (67-100%)] [N/A:N/A] Granulation Quality: [1:Red] [N/A:N/A] Necrotic Amount: [1:Small (1-33%)] [N/A:N/A] Necrotic Tissue: [1:Eschar] [N/A:N/A] Exposed Structures: [1:Fascia: No Fat: No Tendon: No Muscle: No Joint: No] [N/A:N/A] Bone: No Limited to Skin Breakdown Epithelialization: Small (1-33%) N/A N/A Periwound Skin Texture: Edema: Yes N/A N/A Excoriation: No Induration: No Callus: No Crepitus: No Fluctuance: No Friable: No Rash: No Scarring: No Periwound Skin Moist: Yes N/A N/A Moisture: Dry/Scaly: Yes Maceration: No Periwound Skin Color: Atrophie Blanche: No N/A N/A Cyanosis: No Ecchymosis: No Erythema: No Hemosiderin  Staining: No Mottled: No Pallor: No Rubor: No Temperature: No Abnormality N/A N/A Tenderness on No N/A N/A Palpation: Wound Preparation: Ulcer Cleansing: N/A N/A Rinsed/Irrigated with Saline Topical Anesthetic Applied: Other: lidocaine 4% Treatment Notes Electronic Signature(s) Signed: 04/29/2015 4:44:11 PM By: Montey Hora Entered By: Montey Hora on 04/29/2015 08:28:02 [name] ([id number]) -------------------------------------------------------------------------------- Multi-Disciplinary Care Plan Details Patient Name: [name] Date of Service: 04/29/2015 8:15 AM Medical Record Number: [id number] Patient Account Number: [id number] Date of Birth/Sex: Nov 16, 1945 (69 y.o. Male) Treating RN: Montey Hora Primary Care Physician: BABAOFF, MARCUS Other Clinician: Referring Physician: BABAOFF, MARCUS Treating Physician/Extender: Frann Rider in Treatment: 7 Active Inactive Orientation to the Wound Care Program Nursing Diagnoses: Knowledge deficit related to the wound healing center program Goals: Patient/caregiver will verbalize understanding of the [facility] Date Initiated: 03/08/2015 Goal Status: Active Interventions: Provide education on orientation to the wound center Notes: Wound/Skin Impairment Nursing Diagnoses: Impaired tissue integrity Goals: Ulcer/skin breakdown will have a volume reduction of 30% by week 4 Date Initiated: 03/08/2015 Goal Status: Active Interventions: Assess ulceration(s) every visit Notes: Electronic Signature(s) Signed: 04/29/2015 4:44:11 PM By: Montey Hora Entered By: Montey Hora on 04/29/2015 08:27:51 [name] ([id number]) -------------------------------------------------------------------------------- Patient/Caregiver Education Details Patient Name: [name] Date of Service: 04/29/2015 8:15 AM Medical Record Number: [id number] Patient Account Number: [id number] Date  of Birth/Gender: 04-Apr-1946 (69 y.o. Male) Treating RN: Montey Hora Primary Care Physician: Derinda Late Other Clinician: Referring Physician: BABAOFF, MARCUS Treating Physician/Extender: Frann Rider in Treatment: 7 Education Assessment Education Provided To: Patient Education Topics Provided Wound/Skin Impairment: Handouts: Other: wound care as ordered Methods: Demonstration, Explain/Verbal Responses: State content correctly Electronic Signature(s) Signed: 04/29/2015 4:44:11 PM By: Montey Hora Entered By: Montey Hora on 04/29/2015 08:41:51 [name] ([id number]) -------------------------------------------------------------------------------- Wound Assessment Details Patient Name: [name] Date of Service: 04/29/2015 8:15 AM Medical Record Number: [id number] Patient Account Number: [id number] Date of Birth/Sex: 1945/09/15 (69 y.o. Male) Treating RN: Montey Hora Primary Care Physician: BABAOFF, MARCUS Other Clinician: Referring Physician: BABAOFF, MARCUS Treating Physician/Extender: Frann Rider in Treatment: 7 Wound Status Wound Number: 1 Primary Etiology: Trauma, Other Wound Location: Right Lower Leg - Medial Wound Status: Open Wounding Event: Trauma Comorbid History: Cirrhosis , Hepatitis C Date Acquired: 02/26/2015 Weeks Of Treatment: 7 Clustered Wound: No Photos Photo Uploaded By: Montey Hora on 04/29/2015 16:41:18 Wound Measurements Length: (cm) 1.9 Width: (cm) 1.2 Depth: (cm) 0.7 Area: (cm) 1.791 Volume: (cm) 1.253 % Reduction in Area: 91.2% % Reduction in Volume: 38.6% Epithelialization: Small (1-33%) Tunneling: No Undermining: No Wound Description Full Thickness Without Exposed Foul Odor Afte Classification: Support Structures Wound Margin:  Flat and Intact Exudate Small Amount: Exudate Type: Serous Exudate Color: amber r Cleansing: No Wound Bed Granulation Amount: Large (67-100%) Exposed  Structure Granulation Quality: Red Fascia Exposed: No Necrotic Amount: Small (1-33%) Fat Layer Exposed: No Loyd, Matisse ([id number]) Necrotic Quality: Eschar Tendon Exposed: No Muscle Exposed: No Joint Exposed: No Bone Exposed: No Limited to Skin Breakdown Periwound Skin Texture Texture Color No Abnormalities Noted: No No Abnormalities Noted: No Callus: No Atrophie Blanche: No Crepitus: No Cyanosis: No Excoriation: No Ecchymosis: No Fluctuance: No Erythema: No Friable: No Hemosiderin Staining: No Induration: No Mottled: No Localized Edema: Yes Pallor: No Rash: No Rubor: No Scarring: No Temperature / Pain Moisture Temperature: No Abnormality No Abnormalities Noted: No Dry / Scaly: Yes Maceration: No Moist: Yes Wound Preparation Ulcer Cleansing: Rinsed/Irrigated with Saline Topical Anesthetic Applied: Other: lidocaine 4%, Treatment Notes Wound #1 (Right, Medial Lower Leg) 1. Cleansed with: Clean wound with Normal Saline 2. Anesthetic Topical Lidocaine 4% cream to wound bed prior to debridement 3. Peri-wound Care: Skin Prep 4. Dressing Applied: Aquacel Ag 5. Secondary Dressing Applied Bordered Foam Dressing Notes juxtalite applied Electronic Signature(s) Signed: 04/29/2015 4:44:11 PM By: Montey Hora Entered By: Montey Hora on 04/29/2015 08:27:44 JAYSHAWN, COLSTON ([id number], [place] ([id number]) -------------------------------------------------------------------------------- [place] Details Patient Name: [name] Date of Service: 04/29/2015 8:15 AM Medical Record Number: [id number] Patient Account Number: [id number] Date of Birth/Sex: [birth year] (69 y.o. Male) Treating RN: Montey Hora Primary Care Physician: BABAOFF, MARCUS Other Clinician: Referring Physician: BABAOFF, MARCUS Treating Physician/Extender: Frann Rider in Treatment: 7 Vital Signs Time Taken: 08:20 Temperature (F): 98.4 Height (in): 70 Pulse  (bpm): 53 Weight (lbs): 168 Respiratory Rate (breaths/min): 18 Body Mass Index (BMI): 24.1 Blood Pressure (mmHg): 104/60 Reference Range: 80 - 120 mg / dl Electronic Signature(s) Signed: 04/29/2015 4:44:11 PM By: Montey Hora Entered By: Montey Hora on 04/29/2015 08:20:21

## 2015-05-06 ENCOUNTER — Encounter: Payer: Medicare Other | Admitting: Surgery

## 2015-05-06 DIAGNOSIS — S81811A Laceration without foreign body, right lower leg, initial encounter: Secondary | ICD-10-CM | POA: Diagnosis not present

## 2015-05-06 NOTE — Progress Notes (Signed)
[name], [name] ([id number]) Visit Report for 05/06/2015 Arrival Information Details Patient Name: [name], [name] Date of Service: 05/06/2015 8:00 AM Medical Record Number: [id number] Patient Account Number: [id number] Date of Birth/Sex: Dec 13, 1945 (69 y.o. Male) Treating RN: Roy Gouty, RN, BSN, [name] Primary Care Physician: [name] Other Clinician: Referring Physician: BABAOFF, MARCUS Treating Physician/Extender: [name] in Treatment: 8 Visit Information History Since Last Visit Added or deleted any medications: No Patient Arrived: Ambulatory Any new allergies or adverse reactions: No Arrival Time: 08:07 Signs or symptoms of abuse/neglect since last No Accompanied By: self visito Transfer Assistance: None Has Dressing in Place as Prescribed: Yes Patient Identification Verified: Yes Pain Present Now: No Secondary Verification Process Yes Completed: Patient Requires Transmission-Based No Precautions: Patient Has Alerts: Yes Electronic Signature(s) Signed: 05/06/2015 8:07:34 AM By: Regan Lemming BSN, RN Entered By: Regan Lemming on 05/06/2015 08:07:34 [name] ([id number]) -------------------------------------------------------------------------------- Encounter Discharge Information Details Patient Name: [name] Date of Service: 05/06/2015 8:00 AM Medical Record Number: [id number] Patient Account Number: [id number] Date of Birth/Sex: 05-12-1946 (69 y.o. Male) Treating RN: Roy Gouty, RN, BSN, [name] Primary Care Physician: [name] Other Clinician: Referring Physician: BABAOFF, MARCUS Treating Physician/Extender: [name] in Treatment: 8 Encounter Discharge Information Items Discharge Pain Level: 0 Discharge Condition: Stable Ambulatory Status: Ambulatory Discharge Destination: Home Transportation: Private Auto Accompanied By: self Schedule Follow-up Appointment: No Medication Reconciliation completed No and provided to  Patient/Care Kaeleigh Westendorf: Patient Clinical Summary of Care: Declined Electronic Signature(s) Signed: 05/06/2015 9:22:33 AM By: Ruthine Dose Previous Signature: 05/06/2015 8:25:17 AM Version By: Regan Lemming BSN, RN Entered By: Ruthine Dose on 05/06/2015 09:22:33 [name] ([id number]) -------------------------------------------------------------------------------- Lower Extremity Assessment Details Patient Name: [name] Date of Service: 05/06/2015 8:00 AM Medical Record Number: [id number] Patient Account Number: [id number] Date of Birth/Sex: 1946/02/27 (69 y.o. Male) Treating RN: Roy Gouty, RN, BSN, Administrator, sports Primary Care Physician: [name] Other Clinician: Referring Physician: BABAOFF, MARCUS Treating Physician/Extender: [name] in Treatment: 8 Edema Assessment Assessed: [Left: No] [Right: No] E[Left: dema] [Right: :] Calf Left: Right: Point of Measurement: 35 cm From Medial Instep cm 34.3 cm Ankle Left: Right: Point of Measurement: 11 cm From Medial Instep cm 24.3 cm Vascular Assessment Claudication: Claudication Assessment [Right:None] Pulses: Posterior Tibial Dorsalis Pedis Palpable: [Right:Yes] Extremity colors, hair growth, and conditions: Extremity Color: [Right:Red] Hair Growth on Extremity: [Right:No] Temperature of Extremity: [Right:Warm] Capillary Refill: [Right:< 3 seconds] Dependent Rubor: [Right:No] Blanched when Elevated: [Right:No] Lipodermatosclerosis: [Right:No] Toe Nail Assessment Left: Right: Thick: No Discolored: No Deformed: No Improper Length and Hygiene: No ALI, MOHL ([id number]) Electronic Signature(s) Signed: 05/06/2015 8:13:19 AM By: Regan Lemming BSN, RN Entered By: Regan Lemming on 05/06/2015 08:13:19 [name] ([id number]) -------------------------------------------------------------------------------- Multi Wound Chart Details Patient Name: [name] Date of Service: 05/06/2015 8:00  AM Medical Record Number: [id number] Patient Account Number: [id number] Date of Birth/Sex: [birth year] (69 y.o. Male) Treating RN: Roy Gouty, RN, BSN, [name] Primary Care Physician: [name] Other Clinician: Referring Physician: BABAOFF, MARCUS Treating Physician/Extender: [name] in Treatment: 8 Vital Signs Height(in): 70 Pulse(bpm): 55 Weight(lbs): 168 Blood Pressure 102/53 (mmHg): Body Mass Index(BMI): 24 Temperature(F): 98.1 Respiratory Rate 17 (breaths/min): Photos: [1:No Photos] [N/A:N/A] Wound Location: [1:Right Lower Leg - Medial] [N/A:N/A] Wounding Event: [1:Trauma] [N/A:N/A] Primary Etiology: [1:Trauma, Other] [N/A:N/A] Comorbid History: [1:Cirrhosis , Hepatitis C] [N/A:N/A] Date Acquired: [1:02/26/2015] [N/A:N/A] Weeks of Treatment: [1:8] [N/A:N/A] Wound Status: [1:Open] [N/A:N/A] Measurements L x W x D 1.5x1x0.5 [N/A:N/A] (cm) Area (cm) : [1:1.178] [N/A:N/A] Volume (cm) : [1:0.589] [N/A:N/A] % Reduction in Area: [1:94.20%] [  N/A:N/A] % Reduction in Volume: 71.20% [N/A:N/A] Classification: [1:Full Thickness Without Exposed Support Structures] [N/A:N/A] Exudate Amount: [1:Small] [N/A:N/A] Exudate Type: [1:Serous] [N/A:N/A] Exudate Color: [1:amber] [N/A:N/A] Wound Margin: [1:Flat and Intact] [N/A:N/A] Granulation Amount: [1:Large (67-100%)] [N/A:N/A] Granulation Quality: [1:Red] [N/A:N/A] Necrotic Amount: [1:Small (1-33%)] [N/A:N/A] Exposed Structures: [1:Fascia: No Fat: No Tendon: No Muscle: No Joint: No Bone: No] [N/A:N/A] Limited to Skin Breakdown Epithelialization: Small (1-33%) N/A N/A Periwound Skin Texture: Edema: Yes N/A N/A Excoriation: No Induration: No Callus: No Crepitus: No Fluctuance: No Friable: No Rash: No Scarring: No Periwound Skin Moist: Yes N/A N/A Moisture: Dry/Scaly: Yes Maceration: No Periwound Skin Color: Atrophie Blanche: No N/A N/A Cyanosis: No Ecchymosis: No Erythema: No Hemosiderin Staining:  No Mottled: No Pallor: No Rubor: No Temperature: No Abnormality N/A N/A Tenderness on No N/A N/A Palpation: Wound Preparation: Ulcer Cleansing: N/A N/A Rinsed/Irrigated with Saline Topical Anesthetic Applied: Other: lidocaine 4% Treatment Notes Electronic Signature(s) Signed: 05/06/2015 8:16:26 AM By: Regan Lemming BSN, RN Entered By: Regan Lemming on 05/06/2015 08:16:26 [name] ([id number]) -------------------------------------------------------------------------------- Why Details Patient Name: [name] Date of Service: 05/06/2015 8:00 AM Medical Record Number: [id number] Patient Account Number: [id number] Date of Birth/Sex: June 21, 1946 (69 y.o. Male) Treating RN: Roy Gouty, RN, BSN, [name] Primary Care Physician: [name] Other Clinician: Referring Physician: BABAOFF, MARCUS Treating Physician/Extender: [name] in Treatment: 8 Active Inactive Orientation to the Wound Care Program Nursing Diagnoses: Knowledge deficit related to the wound healing center program Goals: Patient/caregiver will verbalize understanding of the [facility] Date Initiated: 03/08/2015 Goal Status: Active Interventions: Provide education on orientation to the wound center Notes: Wound/Skin Impairment Nursing Diagnoses: Impaired tissue integrity Goals: Ulcer/skin breakdown will have a volume reduction of 30% by week 4 Date Initiated: 03/08/2015 Goal Status: Active Interventions: Assess ulceration(s) every visit Notes: Electronic Signature(s) Signed: 05/06/2015 8:16:19 AM By: Regan Lemming BSN, RN Entered By: Regan Lemming on 05/06/2015 08:16:19 [name] ([id number]) -------------------------------------------------------------------------------- Pain Assessment Details Patient Name: [name] Date of Service: 05/06/2015 8:00 AM Medical Record Number: [id number] Patient Account Number: [id number] Date of  Birth/Sex: 11-08-1945 (69 y.o. Male) Treating RN: Roy Gouty, RN, BSN, [name] Primary Care Physician: [name] Other Clinician: Referring Physician: BABAOFF, MARCUS Treating Physician/Extender: [name] in Treatment: 8 Active Problems Location of Pain Severity and Description of Pain Patient Has Paino No Site Locations Pain Management and Medication Current Pain Management: Electronic Signature(s) Signed: 05/06/2015 8:07:41 AM By: Regan Lemming BSN, RN Entered By: Regan Lemming on 05/06/2015 08:07:41 [name] ([id number]) -------------------------------------------------------------------------------- Patient/Caregiver Education Details Patient Name: [name] Date of Service: 05/06/2015 8:00 AM Medical Record Number: [id number] Patient Account Number: [id number] Date of Birth/Gender: 03-24-1946 (69 y.o. Male) Treating RN: Roy Gouty, RN, BSN, [name] Primary Care Physician: [name] Other Clinician: Referring Physician: BABAOFF, MARCUS Treating Physician/Extender: [name] in Treatment: 8 Education Assessment Education Provided To: Patient Education Topics Provided Basic Hygiene: Methods: Explain/Verbal Responses: State content correctly Welcome To [facility]: Methods: Explain/Verbal Responses: State content correctly Electronic Signature(s) Signed: 05/06/2015 8:25:28 AM By: Regan Lemming BSN, RN Entered By: Regan Lemming on 05/06/2015 08:25:28 [name] ([id number]) -------------------------------------------------------------------------------- Wound Assessment Details Patient Name: [name] Date of Service: 05/06/2015 8:00 AM Medical Record Number: [id number] Patient Account Number: [id number] Date of Birth/Sex: 12-Sep-1945 (69 y.o. Male) Treating RN: Roy Gouty, RN, BSN, [name] Primary Care Physician: [name] Other Clinician: Referring Physician: BABAOFF, MARCUS Treating Physician/Extender: [name]  in Treatment: 8 Wound Status Wound Number: 1 Primary Etiology: Trauma, Other Wound  Location: Right Lower Leg - Medial Wound Status: Open Wounding Event: Trauma Comorbid History: Cirrhosis , Hepatitis C Date Acquired: 02/26/2015 Weeks Of Treatment: 8 Clustered Wound: No Wound Measurements Length: (cm) 1.5 Width: (cm) 1 Depth: (cm) 0.5 Area: (cm) 1.178 Volume: (cm) 0.589 % Reduction in Area: 94.2% % Reduction in Volume: 71.2% Epithelialization: Small (1-33%) Tunneling: No Undermining: No Wound Description Full Thickness Without Exposed Foul Odor Afte Classification: Support Structures Wound Margin: Flat and Intact Exudate Small Amount: Exudate Type: Serous Exudate Color: amber r Cleansing: No Wound Bed Granulation Amount: Large (67-100%) Exposed Structure Granulation Quality: Red Fascia Exposed: No Necrotic Amount: Small (1-33%) Fat Layer Exposed: No Necrotic Quality: Adherent Slough Tendon Exposed: No Muscle Exposed: No Joint Exposed: No Bone Exposed: No Limited to Skin Breakdown Periwound Skin Texture Texture Color No Abnormalities Noted: No No Abnormalities Noted: No Callus: No Atrophie Blanche: No Crepitus: No Cyanosis: No Valverde, Sipriano ([id number]) Excoriation: No Ecchymosis: No Fluctuance: No Erythema: No Friable: No Hemosiderin Staining: No Induration: No Mottled: No Localized Edema: Yes Pallor: No Rash: No Rubor: No Scarring: No Temperature / Pain Moisture Temperature: No Abnormality No Abnormalities Noted: No Dry / Scaly: Yes Maceration: No Moist: Yes Wound Preparation Ulcer Cleansing: Rinsed/Irrigated with Saline Topical Anesthetic Applied: Other: lidocaine 4%, Treatment Notes Wound #1 (Right, Medial Lower Leg) 1. Cleansed with: Clean wound with Normal Saline 4. Dressing Applied: Prisma Ag 5. Secondary Dressing Applied Bordered Foam Dressing 7. Secured with Other (specify in notes) Notes juxtalite applied Electronic  Signature(s) Signed: 05/06/2015 8:16:15 AM By: Regan Lemming BSN, RN Entered By: Regan Lemming on 05/06/2015 08:16:15 [name] ([id number]) -------------------------------------------------------------------------------- [place] Details Patient Name: [name] Date of Service: 05/06/2015 8:00 AM Medical Record Number: [id number] Patient Account Number: [id number] Date of Birth/Sex: 02-11-1946 (69 y.o. Male) Treating RN: Afful, RN, BSN, Violet Primary Care Physician: [name] Other Clinician: Referring Physician: BABAOFF, MARCUS Treating Physician/Extender: [name] in Treatment: 8 Vital Signs Time Taken: 08:07 Temperature (F): 98.1 Height (in): 70 Pulse (bpm): 55 Weight (lbs): 168 Respiratory Rate (breaths/min): 17 Body Mass Index (BMI): 24.1 Blood Pressure (mmHg): 102/53 Reference Range: 80 - 120 mg / dl Electronic Signature(s) Signed: 05/06/2015 8:08:17 AM By: Regan Lemming BSN, RN Entered By: Regan Lemming on 05/06/2015 08:08:17
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
## 2015-05-13 ENCOUNTER — Encounter: Payer: Medicare Other | Attending: Surgery | Admitting: Surgery

## 2015-05-13 DIAGNOSIS — Z2252 Carrier of viral hepatitis C: Secondary | ICD-10-CM | POA: Diagnosis not present

## 2015-05-13 DIAGNOSIS — F17218 Nicotine dependence, cigarettes, with other nicotine-induced disorders: Secondary | ICD-10-CM | POA: Diagnosis not present

## 2015-05-13 DIAGNOSIS — X58XXXD Exposure to other specified factors, subsequent encounter: Secondary | ICD-10-CM | POA: Diagnosis not present

## 2015-05-13 DIAGNOSIS — S81811D Laceration without foreign body, right lower leg, subsequent encounter: Secondary | ICD-10-CM | POA: Insufficient documentation

## 2015-05-13 DIAGNOSIS — I890 Lymphedema, not elsewhere classified: Secondary | ICD-10-CM | POA: Insufficient documentation

## 2015-05-13 DIAGNOSIS — K7460 Unspecified cirrhosis of liver: Secondary | ICD-10-CM | POA: Insufficient documentation
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
## 2015-05-14 NOTE — Progress Notes (Signed)
[name], [name] ([id number]) Visit Report for 05/13/2015 Arrival Information Details Patient Name: [name], [name] Date of Service: 05/13/2015 8:45 AM Medical Record Number: [id number] Patient Account Number: [id number] Date of Birth/Sex: 04/21/1946 (69 y.o. Male) Treating RN: Montey Hora Primary Care Physician: Derinda Late Other Clinician: Referring Physician: BABAOFF, MARCUS Treating Physician/Extender: Frann Rider in Treatment: 9 Visit Information History Since Last Visit Added or deleted any medications: No Patient Arrived: Ambulatory Any new allergies or adverse reactions: No Arrival Time: 08:52 Had a fall or experienced change in No Accompanied By: self activities of daily living that may affect Transfer Assistance: None risk of falls: Patient Identification Verified: Yes Signs or symptoms of abuse/neglect since last No Secondary Verification Process Yes visito Completed: Hospitalized since last visit: No Patient Requires Transmission-Based No Pain Present Now: No Precautions: Patient Has Alerts: Yes Electronic Signature(s) Signed: 05/13/2015 5:38:26 PM By: Montey Hora Entered By: Montey Hora on 05/13/2015 08:53:02 [name] ([id number]) -------------------------------------------------------------------------------- Clinic Level of Care Assessment Details Patient Name: [name] Date of Service: 05/13/2015 8:45 AM Medical Record Number: [id number] Patient Account Number: [id number] Date of Birth/Sex: May 08, 1946 (69 y.o. Male) Treating RN: Montey Hora Primary Care Physician: BABAOFF, MARCUS Other Clinician: Referring Physician: BABAOFF, MARCUS Treating Physician/Extender: Frann Rider in Treatment: 9 Clinic Level of Care Assessment Items TOOL 4 Quantity Score '[]'$  - Use when only an EandM is performed on FOLLOW-UP visit 0 ASSESSMENTS - Nursing Assessment / Reassessment X - Reassessment of Co-morbidities (includes updates in  patient status) 1 10 X - Reassessment of Adherence to Treatment Plan 1 5 ASSESSMENTS - Wound and Skin Assessment / Reassessment X - Simple Wound Assessment / Reassessment - one wound 1 5 '[]'$  - Complex Wound Assessment / Reassessment - multiple wounds 0 '[]'$  - Dermatologic / Skin Assessment (not related to wound area) 0 ASSESSMENTS - Focused Assessment X - Circumferential Edema Measurements - multi extremities 1 5 '[]'$  - Nutritional Assessment / Counseling / Intervention 0 X - Lower Extremity Assessment (monofilament, tuning fork, pulses) 1 5 '[]'$  - Peripheral Arterial Disease Assessment (using hand held doppler) 0 ASSESSMENTS - Ostomy and/or Continence Assessment and Care '[]'$  - Incontinence Assessment and Management 0 '[]'$  - Ostomy Care Assessment and Management (repouching, etc.) 0 PROCESS - Coordination of Care X - Simple Patient / Family Education for ongoing care 1 15 '[]'$  - Complex (extensive) Patient / Family Education for ongoing care 0 '[]'$  - Staff obtains Programmer, systems, Records, Test Results / Process Orders 0 '[]'$  - Staff telephones HHA, Nursing Homes / Clarify orders / etc 0 '[]'$  - Routine Transfer to another Facility (non-emergent condition) 0 [name], [name] ([id number]) '[]'$  - Routine Hospital Admission (non-emergent condition) 0 '[]'$  - New Admissions / Biomedical engineer / Ordering NPWT, Apligraf, etc. 0 '[]'$  - Emergency Hospital Admission (emergent condition) 0 X - Simple Discharge Coordination 1 10 '[]'$  - Complex (extensive) Discharge Coordination 0 PROCESS - Special Needs '[]'$  - Pediatric / Minor Patient Management 0 '[]'$  - Isolation Patient Management 0 '[]'$  - Hearing / Language / Visual special needs 0 '[]'$  - Assessment of Community assistance (transportation, D/C planning, etc.) 0 '[]'$  - Additional assistance / Altered mentation 0 '[]'$  - Support Surface(s) Assessment (bed, cushion, seat, etc.) 0 INTERVENTIONS - Wound Cleansing / Measurement X - Simple Wound Cleansing - one wound 1 5 '[]'$  - Complex  Wound Cleansing - multiple wounds 0 X - Wound Imaging (photographs - any number of wounds) 1 5 '[]'$  - Wound Tracing (instead of photographs) 0 X - Simple Wound Measurement -  one wound 1 5 '[]'$  - Complex Wound Measurement - multiple wounds 0 INTERVENTIONS - Wound Dressings X - Small Wound Dressing one or multiple wounds 1 10 '[]'$  - Medium Wound Dressing one or multiple wounds 0 '[]'$  - Large Wound Dressing one or multiple wounds 0 '[]'$  - Application of Medications - topical 0 '[]'$  - Application of Medications - injection 0 INTERVENTIONS - Miscellaneous '[]'$  - External ear exam 0 [name], [name] ([id number]) '[]'$  - Specimen Collection (cultures, biopsies, blood, body fluids, etc.) 0 '[]'$  - Specimen(s) / Culture(s) sent or taken to Lab for analysis 0 '[]'$  - Patient Transfer (multiple staff / Harrel Lemon Lift / Similar devices) 0 '[]'$  - Simple Staple / Suture removal (25 or less) 0 '[]'$  - Complex Staple / Suture removal (26 or more) 0 '[]'$  - Hypo / Hyperglycemic Management (close monitor of Blood Glucose) 0 '[]'$  - Ankle / Brachial Index (ABI) - do not check if billed separately 0 X - Vital Signs 1 5 Has the patient been seen at the hospital within the last three years: Yes Total Score: 85 Level Of Care: New/Established - Level 3 Electronic Signature(s) Signed: 05/13/2015 5:38:26 PM By: Montey Hora Entered By: Montey Hora on 05/13/2015 09:08:20 [name] ([id number]) -------------------------------------------------------------------------------- Encounter Discharge Information Details Patient Name: [name] Date of Service: 05/13/2015 8:45 AM Medical Record Number: [id number] Patient Account Number: [id number] Date of Birth/Sex: 1946/06/26 (69 y.o. Male) Treating RN: Montey Hora Primary Care Physician: BABAOFF, MARCUS Other Clinician: Referring Physician: BABAOFF, MARCUS Treating Physician/Extender: Frann Rider in Treatment: 9 Encounter Discharge Information Items Discharge Pain Level:  0 Discharge Condition: Stable Ambulatory Status: Ambulatory Discharge Destination: Home Transportation: Private Auto Accompanied By: self Schedule Follow-up Appointment: Yes Medication Reconciliation completed No and provided to Patient/Care Aydenn Gervin: Patient Clinical Summary of Care: Declined Electronic Signature(s) Signed: 05/13/2015 11:05:50 AM By: Ruthine Dose Entered By: Ruthine Dose on 05/13/2015 11:05:50 [name] ([id number]) -------------------------------------------------------------------------------- Lower Extremity Assessment Details Patient Name: [name] Date of Service: 05/13/2015 8:45 AM Medical Record Number: [id number] Patient Account Number: [id number] Date of Birth/Sex: 1945-11-15 (69 y.o. Male) Treating RN: Montey Hora Primary Care Physician: BABAOFF, MARCUS Other Clinician: Referring Physician: BABAOFF, MARCUS Treating Physician/Extender: Frann Rider in Treatment: 9 Edema Assessment Assessed: [Left: No] [Right: No] Edema: [Left: Ye] [Right: s] Calf Left: Right: Point of Measurement: 35 cm From Medial Instep cm 33.4 cm Ankle Left: Right: Point of Measurement: 11 cm From Medial Instep cm 24 cm Vascular Assessment Pulses: Posterior Tibial Dorsalis Pedis Palpable: [Right:Yes] Extremity colors, hair growth, and conditions: Extremity Color: [Right:Hyperpigmented] Hair Growth on Extremity: [Right:Yes] Temperature of Extremity: [Right:Warm] Capillary Refill: [Right:< 3 seconds] Electronic Signature(s) Signed: 05/13/2015 5:38:26 PM By: Montey Hora Entered By: Montey Hora on 05/13/2015 09:00:41 [name] ([id number]) -------------------------------------------------------------------------------- Multi Wound Chart Details Patient Name: [name] Date of Service: 05/13/2015 8:45 AM Medical Record Number: [id number] Patient Account Number: [id number] Date of Birth/Sex: [birth year] (69 y.o. Male) Treating RN:  Montey Hora Primary Care Physician: BABAOFF, MARCUS Other Clinician: Referring Physician: BABAOFF, MARCUS Treating Physician/Extender: Frann Rider in Treatment: 9 Vital Signs Height(in): 70 Pulse(bpm): 53 Weight(lbs): 168 Blood Pressure 100/67 (mmHg): Body Mass Index(BMI): 24 Temperature(F): 98.4 Respiratory Rate 18 (breaths/min): Photos: [1:No Photos] [N/A:N/A] Wound Location: [1:Right Lower Leg - Medial] [N/A:N/A] Wounding Event: [1:Trauma] [N/A:N/A] Primary Etiology: [1:Trauma, Other] [N/A:N/A] Comorbid History: [1:Cirrhosis , Hepatitis C] [N/A:N/A] Date Acquired: [1:02/26/2015] [N/A:N/A] Weeks of Treatment: [1:9] [N/A:N/A] Wound Status: [1:Open] [N/A:N/A] Measurements L x W x D 1.1x0.6x0.3 [N/A:N/A] (cm) Area (cm) : [1:0.518] [  N/A:N/A] Volume (cm) : [1:0.156] [N/A:N/A] % Reduction in Area: [1:97.50%] [N/A:N/A] % Reduction in Volume: 92.40% [N/A:N/A] Classification: [1:Full Thickness Without Exposed Support Structures] [N/A:N/A] Exudate Amount: [1:Small] [N/A:N/A] Exudate Type: [1:Serous] [N/A:N/A] Exudate Color: [1:amber] [N/A:N/A] Wound Margin: [1:Flat and Intact] [N/A:N/A] Granulation Amount: [1:Large (67-100%)] [N/A:N/A] Granulation Quality: [1:Red] [N/A:N/A] Necrotic Amount: [1:Small (1-33%)] [N/A:N/A] Exposed Structures: [1:Fascia: No Fat: No Tendon: No Muscle: No Joint: No Bone: No] [N/A:N/A] Limited to Skin Breakdown Epithelialization: Small (1-33%) N/A N/A Periwound Skin Texture: Edema: Yes N/A N/A Excoriation: No Induration: No Callus: No Crepitus: No Fluctuance: No Friable: No Rash: No Scarring: No Periwound Skin Moist: Yes N/A N/A Moisture: Dry/Scaly: Yes Maceration: No Periwound Skin Color: Atrophie Blanche: No N/A N/A Cyanosis: No Ecchymosis: No Erythema: No Hemosiderin Staining: No Mottled: No Pallor: No Rubor: No Temperature: No Abnormality N/A N/A Tenderness on No N/A N/A Palpation: Wound Preparation: Ulcer  Cleansing: N/A N/A Rinsed/Irrigated with Saline Topical Anesthetic Applied: Other: lidocaine 4% Treatment Notes Electronic Signature(s) Signed: 05/13/2015 5:38:26 PM By: Montey Hora Entered By: Montey Hora on 05/13/2015 09:00:54 [name] ([id number]) -------------------------------------------------------------------------------- Multi-Disciplinary Care Plan Details Patient Name: [name] Date of Service: 05/13/2015 8:45 AM Medical Record Number: [id number] Patient Account Number: [id number] Date of Birth/Sex: 02/19/1946 (69 y.o. Male) Treating RN: Montey Hora Primary Care Physician: BABAOFF, MARCUS Other Clinician: Referring Physician: BABAOFF, MARCUS Treating Physician/Extender: Frann Rider in Treatment: 9 Active Inactive Orientation to the Wound Care Program Nursing Diagnoses: Knowledge deficit related to the wound healing center program Goals: Patient/caregiver will verbalize understanding of the [facility] Program Date Initiated: 03/08/2015 Goal Status: Active Interventions: Provide education on orientation to the wound center Notes: Wound/Skin Impairment Nursing Diagnoses: Impaired tissue integrity Goals: Ulcer/skin breakdown will have a volume reduction of 30% by week 4 Date Initiated: 03/08/2015 Goal Status: Active Interventions: Assess ulceration(s) every visit Notes: Electronic Signature(s) Signed: 05/13/2015 5:38:26 PM By: Montey Hora Entered By: Montey Hora on 05/13/2015 09:00:48 [name] ([id number]) -------------------------------------------------------------------------------- Patient/Caregiver Education Details Patient Name: [name] Date of Service: 05/13/2015 8:45 AM Medical Record Number: [id number] Patient Account Number: [id number] Date of Birth/Gender: Oct 29, 1945 (69 y.o. Male) Treating RN: Montey Hora Primary Care Physician: Derinda Late Other Clinician: Referring Physician:  BABAOFF, MARCUS Treating Physician/Extender: Frann Rider in Treatment: 9 Education Assessment Education Provided To: Patient Education Topics Provided Venous: Handouts: Other: continue with juxtalite Methods: Demonstration, Explain/Verbal Responses: State content correctly Wound/Skin Impairment: Handouts: Other: wound care as ordered Methods: Demonstration, Explain/Verbal Responses: State content correctly Electronic Signature(s) Signed: 05/13/2015 5:38:26 PM By: Montey Hora Entered By: Montey Hora on 05/13/2015 09:07:10 [name] ([id number]) -------------------------------------------------------------------------------- Wound Assessment Details Patient Name: [name] Date of Service: 05/13/2015 8:45 AM Medical Record Number: [id number] Patient Account Number: [id number] Date of Birth/Sex: 04/19/1946 (69 y.o. Male) Treating RN: Montey Hora Primary Care Physician: BABAOFF, MARCUS Other Clinician: Referring Physician: BABAOFF, MARCUS Treating Physician/Extender: Frann Rider in Treatment: 9 Wound Status Wound Number: 1 Primary Etiology: Trauma, Other Wound Location: Right Lower Leg - Medial Wound Status: Open Wounding Event: Trauma Comorbid History: Cirrhosis , Hepatitis C Date Acquired: 02/26/2015 Weeks Of Treatment: 9 Clustered Wound: No Photos Photo Uploaded By: Montey Hora on 05/13/2015 09:22:12 Wound Measurements Length: (cm) 1.1 Width: (cm) 0.6 Depth: (cm) 0.3 Area: (cm) 0.518 Volume: (cm) 0.156 % Reduction in Area: 97.5% % Reduction in Volume: 92.4% Epithelialization: Small (1-33%) Tunneling: No Undermining: No Wound Description Full Thickness Without Exposed Foul Odor Afte Classification: Support Structures Wound Margin: Flat and Intact Exudate Small Amount: Exudate  Type: Serous Exudate Color: amber r Cleansing: No Wound Bed Granulation Amount: Large (67-100%) Exposed Structure Granulation Quality:  Red Fascia Exposed: No Necrotic Amount: Small (1-33%) Fat Layer Exposed: No [name], [name] ([id number]) Necrotic Quality: Adherent Slough Tendon Exposed: No Muscle Exposed: No Joint Exposed: No Bone Exposed: No Limited to Skin Breakdown Periwound Skin Texture Texture Color No Abnormalities Noted: No No Abnormalities Noted: No Callus: No Atrophie Blanche: No Crepitus: No Cyanosis: No Excoriation: No Ecchymosis: No Fluctuance: No Erythema: No Friable: No Hemosiderin Staining: No Induration: No Mottled: No Localized Edema: Yes Pallor: No Rash: No Rubor: No Scarring: No Temperature / Pain Moisture Temperature: No Abnormality No Abnormalities Noted: No Dry / Scaly: Yes Maceration: No Moist: Yes Wound Preparation Ulcer Cleansing: Rinsed/Irrigated with Saline Topical Anesthetic Applied: Other: lidocaine 4%, Treatment Notes Wound #1 (Right, Medial Lower Leg) 1. Cleansed with: Clean wound with Normal Saline 2. Anesthetic Topical Lidocaine 4% cream to wound bed prior to debridement 3. Peri-wound Care: Skin Prep 4. Dressing Applied: Prisma Ag 5. Secondary Dressing Applied Bordered Foam Dressing Notes juxtalite applied Electronic Signature(s) Signed: 05/13/2015 5:38:26 PM By: Montey Hora Entered By: Montey Hora on 05/13/2015 08:59:31 [name], [name] ([id number], [name] ([id number]) -------------------------------------------------------------------------------- [place] Details Patient Name: [name] Date of Service: 05/13/2015 8:45 AM Medical Record Number: [id number] Patient Account Number: [id number] Date of Birth/Sex: 04/20/1946 (69 y.o. Male) Treating RN: Montey Hora Primary Care Physician: BABAOFF, MARCUS Other Clinician: Referring Physician: BABAOFF, MARCUS Treating Physician/Extender: Frann Rider in Treatment: 9 Vital Signs Time Taken: 08:53 Temperature (F): 98.4 Height (in): 70 Pulse (bpm): 53 Weight (lbs):  168 Respiratory Rate (breaths/min): 18 Body Mass Index (BMI): 24.1 Blood Pressure (mmHg): 100/67 Reference Range: 80 - 120 mg / dl Electronic Signature(s) Signed: 05/13/2015 5:38:26 PM By: Montey Hora Entered By: Montey Hora on 05/13/2015 08:54:23

## 2015-05-20 ENCOUNTER — Encounter: Payer: Medicare Other | Admitting: Surgery

## 2015-05-20 DIAGNOSIS — S81811D Laceration without foreign body, right lower leg, subsequent encounter: Secondary | ICD-10-CM | POA: Diagnosis not present
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
## 2015-05-21 NOTE — Progress Notes (Addendum)
[name], [name] ([id number]) Visit Report for 05/20/2015 Arrival Information Details Patient Name: [name], [name] Date of Service: 05/20/2015 10:45 AM Medical Record Number: [id number] Patient Account Number: [id number] Date of Birth/Sex: Aug 31, 1946 (69 y.o. Male) Treating RN: Montey Hora Primary Care Physician: Derinda Late Other Clinician: Referring Physician: BABAOFF, MARCUS Treating Physician/Extender: Frann Rider in Treatment: 10 Visit Information History Since Last Visit Added or deleted any medications: No Patient Arrived: Ambulatory Any new allergies or adverse reactions: No Arrival Time: 10:44 Had a fall or experienced change in No Accompanied By: self activities of daily living that may affect Transfer Assistance: None risk of falls: Patient Identification Verified: Yes Signs or symptoms of abuse/neglect since last No Secondary Verification Process Yes visito Completed: Hospitalized since last visit: No Patient Requires Transmission-Based No Pain Present Now: No Precautions: Patient Has Alerts: Yes Electronic Signature(s) Signed: 05/20/2015 5:13:06 PM By: Montey Hora Entered By: Montey Hora on 05/20/2015 10:44:43 [name] ([id number]) -------------------------------------------------------------------------------- Clinic Level of Care Assessment Details Patient Name: [name] Date of Service: 05/20/2015 10:45 AM Medical Record Number: [id number] Patient Account Number: [id number] Date of Birth/Sex: [birth year] (69 y.o. Male) Treating RN: Montey Hora Primary Care Physician: BABAOFF, MARCUS Other Clinician: Referring Physician: BABAOFF, MARCUS Treating Physician/Extender: Frann Rider in Treatment: 10 Clinic Level of Care Assessment Items TOOL 4 Quantity Score '[]'$  - Use when only an EandM is performed on FOLLOW-UP visit 0 ASSESSMENTS - Nursing Assessment / Reassessment X - Reassessment of Co-morbidities (includes  updates in patient status) 1 10 X - Reassessment of Adherence to Treatment Plan 1 5 ASSESSMENTS - Wound and Skin Assessment / Reassessment X - Simple Wound Assessment / Reassessment - one wound 1 5 '[]'$  - Complex Wound Assessment / Reassessment - multiple wounds 0 '[]'$  - Dermatologic / Skin Assessment (not related to wound area) 0 ASSESSMENTS - Focused Assessment X - Circumferential Edema Measurements - multi extremities 1 5 '[]'$  - Nutritional Assessment / Counseling / Intervention 0 X - Lower Extremity Assessment (monofilament, tuning fork, pulses) 1 5 '[]'$  - Peripheral Arterial Disease Assessment (using hand held doppler) 0 ASSESSMENTS - Ostomy and/or Continence Assessment and Care '[]'$  - Incontinence Assessment and Management 0 '[]'$  - Ostomy Care Assessment and Management (repouching, etc.) 0 PROCESS - Coordination of Care X - Simple Patient / Family Education for ongoing care 1 15 '[]'$  - Complex (extensive) Patient / Family Education for ongoing care 0 '[]'$  - Staff obtains Programmer, systems, Records, Test Results / Process Orders 0 '[]'$  - Staff telephones HHA, Nursing Homes / Clarify orders / etc 0 '[]'$  - Routine Transfer to another Facility (non-emergent condition) 0 [name], [name] ([id number]) '[]'$  - Routine Hospital Admission (non-emergent condition) 0 '[]'$  - New Admissions / Biomedical engineer / Ordering NPWT, Apligraf, etc. 0 '[]'$  - Emergency Hospital Admission (emergent condition) 0 X - Simple Discharge Coordination 1 10 '[]'$  - Complex (extensive) Discharge Coordination 0 PROCESS - Special Needs '[]'$  - Pediatric / Minor Patient Management 0 '[]'$  - Isolation Patient Management 0 '[]'$  - Hearing / Language / Visual special needs 0 '[]'$  - Assessment of Community assistance (transportation, D/C planning, etc.) 0 '[]'$  - Additional assistance / Altered mentation 0 '[]'$  - Support Surface(s) Assessment (bed, cushion, seat, etc.) 0 INTERVENTIONS - Wound Cleansing / Measurement X - Simple Wound Cleansing - one wound 1 5 '[]'$   - Complex Wound Cleansing - multiple wounds 0 X - Wound Imaging (photographs - any number of wounds) 1 5 '[]'$  - Wound Tracing (instead of photographs) 0 X - Simple Wound Measurement -  one wound 1 5 '[]'$  - Complex Wound Measurement - multiple wounds 0 INTERVENTIONS - Wound Dressings X - Small Wound Dressing one or multiple wounds 1 10 '[]'$  - Medium Wound Dressing one or multiple wounds 0 '[]'$  - Large Wound Dressing one or multiple wounds 0 '[]'$  - Application of Medications - topical 0 '[]'$  - Application of Medications - injection 0 INTERVENTIONS - Miscellaneous '[]'$  - External ear exam 0 [name], [name] ([id number]) '[]'$  - Specimen Collection (cultures, biopsies, blood, body fluids, etc.) 0 '[]'$  - Specimen(s) / Culture(s) sent or taken to Lab for analysis 0 '[]'$  - Patient Transfer (multiple staff / Harrel Lemon Lift / Similar devices) 0 '[]'$  - Simple Staple / Suture removal (25 or less) 0 '[]'$  - Complex Staple / Suture removal (26 or more) 0 '[]'$  - Hypo / Hyperglycemic Management (close monitor of Blood Glucose) 0 '[]'$  - Ankle / Brachial Index (ABI) - do not check if billed separately 0 X - Vital Signs 1 5 Has the patient been seen at the hospital within the last three years: Yes Total Score: 85 Level Of Care: New/Established - Level 3 Electronic Signature(s) Signed: 05/20/2015 5:13:06 PM By: Montey Hora Entered By: Montey Hora on 05/20/2015 11:01:00 [name] ([id number]) -------------------------------------------------------------------------------- Encounter Discharge Information Details Patient Name: [name] Date of Service: 05/20/2015 10:45 AM Medical Record Number: [id number] Patient Account Number: [id number] Date of Birth/Sex: 1946/05/11 (69 y.o. Male) Treating RN: Montey Hora Primary Care Physician: BABAOFF, MARCUS Other Clinician: Referring Physician: BABAOFF, MARCUS Treating Physician/Extender: Frann Rider in Treatment: 10 Encounter Discharge Information Items Discharge  Pain Level: 0 Discharge Condition: Stable Ambulatory Status: Ambulatory Discharge Destination: Home Transportation: Private Auto Accompanied By: self Schedule Follow-up Appointment: Yes Medication Reconciliation completed and provided to Patient/Care No [name]: Provided on Clinical Summary of Care: 05/20/2015 Form Type Recipient Paper Patient JD Electronic Signature(s) Signed: 05/20/2015 11:08:05 AM By: Ruthine Dose Entered By: Ruthine Dose on 05/20/2015 11:08:04 [name] ([id number]) -------------------------------------------------------------------------------- Lower Extremity Assessment Details Patient Name: [name] Date of Service: 05/20/2015 10:45 AM Medical Record Number: [id number] Patient Account Number: [id number] Date of Birth/Sex: 10/14/1945 (69 y.o. Male) Treating RN: Montey Hora Primary Care Physician: BABAOFF, MARCUS Other Clinician: Referring Physician: BABAOFF, MARCUS Treating Physician/Extender: Frann Rider in Treatment: 10 Edema Assessment Assessed: [Left: No] [Right: No] Edema: [Left: Ye] [Right: s] Calf Left: Right: Point of Measurement: 35 cm From Medial Instep cm 33.8 cm Ankle Left: Right: Point of Measurement: 11 cm From Medial Instep cm 24.5 cm Vascular Assessment Pulses: Posterior Tibial Dorsalis Pedis Palpable: [Right:Yes] Extremity colors, hair growth, and conditions: Extremity Color: [Right:Hyperpigmented] Hair Growth on Extremity: [Right:Yes] Temperature of Extremity: [Right:Warm] Capillary Refill: [Right:< 3 seconds] Toe Nail Assessment Left: Right: Thick: No Discolored: No Deformed: No Improper Length and Hygiene: No Electronic Signature(s) Signed: 05/20/2015 5:13:06 PM By: Montey Hora Entered By: Montey Hora on 05/20/2015 10:50:22 [name] ([id number]) -------------------------------------------------------------------------------- Multi Wound Chart Details Patient Name: [name] Date of Service: 05/20/2015 10:45 AM Medical Record Number: [id number] Patient Account Number: [id number] Date of Birth/Sex: 02/02/1946 (69 y.o. Male) Treating RN: Montey Hora Primary Care Physician: BABAOFF, MARCUS Other Clinician: Referring Physician: BABAOFF, MARCUS Treating Physician/Extender: Frann Rider in Treatment: 10 Vital Signs Height(in): 70 Pulse(bpm): 58 Weight(lbs): 168 Blood Pressure 109/60 (mmHg): Body Mass Index(BMI): 24 Temperature(F): 98.2 Respiratory Rate 18 (breaths/min): Photos: [1:No Photos] [N/A:N/A] Wound Location: [1:Right Lower Leg - Medial] [N/A:N/A] Wounding Event: [1:Trauma] [N/A:N/A] Primary Etiology: [1:Trauma, Other] [N/A:N/A] Comorbid History: [1:Cirrhosis , Hepatitis C] [N/A:N/A] Date Acquired: [1:02/26/2015] [  N/A:N/A] Weeks of Treatment: [1:10] [N/A:N/A] Wound Status: [1:Open] [N/A:N/A] Measurements L x W x D 0.9x0.4x0.2 [N/A:N/A] (cm) Area (cm) : [1:0.283] [N/A:N/A] Volume (cm) : [1:0.057] [N/A:N/A] % Reduction in Area: [1:98.60%] [N/A:N/A] % Reduction in Volume: 97.20% [N/A:N/A] Classification: [1:Full Thickness Without Exposed Support Structures] [N/A:N/A] Exudate Amount: [1:Small] [N/A:N/A] Exudate Type: [1:Serous] [N/A:N/A] Exudate Color: [1:amber] [N/A:N/A] Wound Margin: [1:Flat and Intact] [N/A:N/A] Granulation Amount: [1:Large (67-100%)] [N/A:N/A] Granulation Quality: [1:Red] [N/A:N/A] Necrotic Amount: [1:Small (1-33%)] [N/A:N/A] Exposed Structures: [1:Fascia: No Fat: No Tendon: No Muscle: No Joint: No Bone: No] [N/A:N/A] Limited to Skin Breakdown Epithelialization: Small (1-33%) N/A N/A Periwound Skin Texture: Edema: Yes N/A N/A Excoriation: No Induration: No Callus: No Crepitus: No Fluctuance: No Friable: No Rash: No Scarring: No Periwound Skin Moist: Yes N/A N/A Moisture: Dry/Scaly: Yes Maceration: No Periwound Skin Color: Atrophie Blanche: No N/A N/A Cyanosis: No Ecchymosis: No Erythema:  No Hemosiderin Staining: No Mottled: No Pallor: No Rubor: No Temperature: No Abnormality N/A N/A Tenderness on No N/A N/A Palpation: Wound Preparation: Ulcer Cleansing: N/A N/A Rinsed/Irrigated with Saline Topical Anesthetic Applied: Other: lidocaine 4% Treatment Notes Electronic Signature(s) Signed: 05/20/2015 5:13:06 PM By: Montey Hora Entered By: Montey Hora on 05/20/2015 10:52:47 [name] ([id number]) -------------------------------------------------------------------------------- Multi-Disciplinary Care Plan Details Patient Name: [name] Date of Service: 05/20/2015 10:45 AM Medical Record Number: [id number] Patient Account Number: [id number] Date of Birth/Sex: 1946/01/19 (69 y.o. Male) Treating RN: Montey Hora Primary Care Physician: Derinda Late Other Clinician: Referring Physician: BABAOFF, MARCUS Treating Physician/Extender: Frann Rider in Treatment: 10 Active Inactive Electronic Signature(s) Signed: 05/26/2015 5:36:21 PM By: Gretta Cool RN, BSN, Kim RN, BSN Signed: 05/26/2015 5:43:43 PM By: Montey Hora Previous Signature: 05/20/2015 5:13:06 PM Version By: Montey Hora Entered By: Gretta Cool RN, BSN, Kim on 05/26/2015 12:27:26 [name] ([id number]) -------------------------------------------------------------------------------- Patient/Caregiver Education Details Patient Name: [name] Date of Service: 05/20/2015 10:45 AM Medical Record Number: [id number] Patient Account Number: [id number] Date of Birth/Gender: 10-Dec-1945 (69 y.o. Male) Treating RN: Montey Hora Primary Care Physician: BABAOFF, MARCUS Other Clinician: Referring Physician: BABAOFF, MARCUS Treating Physician/Extender: Frann Rider in Treatment: 10 Education Assessment Education Provided To: Patient Education Topics Provided Venous: Handouts: Other: continue with juxtalite Methods: Explain/Verbal Responses: State content  correctly Electronic Signature(s) Signed: 05/20/2015 5:13:06 PM By: Montey Hora Entered By: Montey Hora on 05/20/2015 11:08:33 [name] ([id number]) -------------------------------------------------------------------------------- Wound Assessment Details Patient Name: [name] Date of Service: 05/20/2015 10:45 AM Medical Record Number: [id number] Patient Account Number: [id number] Date of Birth/Sex: 09-26-1945 (69 y.o. Male) Treating RN: Montey Hora Primary Care Physician: BABAOFF, MARCUS Other Clinician: Referring Physician: BABAOFF, MARCUS Treating Physician/Extender: Frann Rider in Treatment: 10 Wound Status Wound Number: 1 Primary Etiology: Trauma, Other Wound Location: Right Lower Leg - Medial Wound Status: Open Wounding Event: Trauma Comorbid History: Cirrhosis , Hepatitis C Date Acquired: 02/26/2015 Weeks Of Treatment: 10 Clustered Wound: No Photos Photo Uploaded By: Montey Hora on 05/20/2015 11:13:15 Wound Measurements Length: (cm) 0.9 Width: (cm) 0.4 Depth: (cm) 0.2 Area: (cm) 0.283 Volume: (cm) 0.057 % Reduction in Area: 98.6% % Reduction in Volume: 97.2% Epithelialization: Small (1-33%) Tunneling: No Undermining: No Wound Description Full Thickness Without Exposed Foul Odor Afte Classification: Support Structures Wound Margin: Flat and Intact Exudate Small Amount: Exudate Type: Serous Exudate Color: amber r Cleansing: No Wound Bed Granulation Amount: Large (67-100%) Exposed Structure Granulation Quality: Red Fascia Exposed: No Necrotic Amount: Small (1-33%) Fat Layer Exposed: No [name], Roy ([id number]) Necrotic Quality: Adherent Slough Tendon Exposed: No Muscle Exposed: No Joint Exposed: No Bone  Exposed: No Limited to Skin Breakdown Periwound Skin Texture Texture Color No Abnormalities Noted: No No Abnormalities Noted: No Callus: No Atrophie Blanche: No Crepitus: No Cyanosis: No Excoriation:  No Ecchymosis: No Fluctuance: No Erythema: No Friable: No Hemosiderin Staining: No Induration: No Mottled: No Localized Edema: Yes Pallor: No Rash: No Rubor: No Scarring: No Temperature / Pain Moisture Temperature: No Abnormality No Abnormalities Noted: No Dry / Scaly: Yes Maceration: No Moist: Yes Wound Preparation Ulcer Cleansing: Rinsed/Irrigated with Saline Topical Anesthetic Applied: Other: lidocaine 4%, Electronic Signature(s) Signed: 05/20/2015 5:13:06 PM By: Montey Hora Entered By: Montey Hora on 05/20/2015 10:52:24 [name] ([id number]) -------------------------------------------------------------------------------- [place] Details Patient Name: [name] Date of Service: 05/20/2015 10:45 AM Medical Record Number: [id number] Patient Account Number: [id number] Date of Birth/Sex: Feb 23, 1946 (69 y.o. Male) Treating RN: Montey Hora Primary Care Physician: BABAOFF, MARCUS Other Clinician: Referring Physician: BABAOFF, MARCUS Treating Physician/Extender: Frann Rider in Treatment: 10 Vital Signs Time Taken: 10:47 Temperature (F): 98.2 Height (in): 70 Pulse (bpm): 58 Weight (lbs): 168 Respiratory Rate (breaths/min): 18 Body Mass Index (BMI): 24.1 Blood Pressure (mmHg): 109/60 Reference Range: 80 - 120 mg / dl Electronic Signature(s) Signed: 05/20/2015 5:13:06 PM By: Montey Hora Entered By: Montey Hora on 05/20/2015 10:47:45

## 2015-05-27 ENCOUNTER — Ambulatory Visit: Payer: Medicare Other | Admitting: Surgery

## 2015-06-28 DIAGNOSIS — Z720 Tobacco use: Secondary | ICD-10-CM | POA: Insufficient documentation

## 2015-10-12 DIAGNOSIS — K279 Peptic ulcer, site unspecified, unspecified as acute or chronic, without hemorrhage or perforation: Secondary | ICD-10-CM | POA: Insufficient documentation

## 2015-10-18 DIAGNOSIS — M47812 Spondylosis without myelopathy or radiculopathy, cervical region: Secondary | ICD-10-CM | POA: Insufficient documentation

## 2015-10-18 DIAGNOSIS — M7581 Other shoulder lesions, right shoulder: Secondary | ICD-10-CM | POA: Insufficient documentation

## 2016-12-11 ENCOUNTER — Other Ambulatory Visit: Payer: Self-pay | Admitting: Specialist

## 2016-12-11 DIAGNOSIS — R918 Other nonspecific abnormal finding of lung field: Secondary | ICD-10-CM

## 2016-12-11 DIAGNOSIS — R0602 Shortness of breath: Secondary | ICD-10-CM

## 2016-12-18 ENCOUNTER — Ambulatory Visit
Admission: RE | Admit: 2016-12-18 | Discharge: 2016-12-18 | Disposition: A | Discharge status: Home / Self Care | Payer: Medicare Other | Source: Ambulatory Visit | Attending: Specialist | Admitting: Specialist

## 2016-12-18 DIAGNOSIS — R0602 Shortness of breath: Secondary | ICD-10-CM | POA: Insufficient documentation

## 2016-12-18 DIAGNOSIS — K7460 Unspecified cirrhosis of liver: Secondary | ICD-10-CM | POA: Diagnosis not present

## 2016-12-18 DIAGNOSIS — C7951 Secondary malignant neoplasm of bone: Secondary | ICD-10-CM | POA: Insufficient documentation

## 2016-12-18 DIAGNOSIS — R161 Splenomegaly, not elsewhere classified: Secondary | ICD-10-CM | POA: Insufficient documentation

## 2016-12-18 DIAGNOSIS — R918 Other nonspecific abnormal finding of lung field: Secondary | ICD-10-CM | POA: Diagnosis present

## 2016-12-18 LAB — GLUCOSE, CAPILLARY: Glucose-Capillary: 91 mg/dL (ref 65–99)

## 2016-12-18 MED ORDER — FLUDEOXYGLUCOSE F - 18 (FDG) INJECTION
12.4200 | Freq: Once | INTRAVENOUS | Status: AC | PRN
  Administered 2016-12-18: 12.42 via INTRAVENOUS

## 2016-12-20 ENCOUNTER — Other Ambulatory Visit: Payer: Self-pay | Admitting: *Deleted

## 2016-12-20 DIAGNOSIS — R918 Other nonspecific abnormal finding of lung field: Secondary | ICD-10-CM

## 2016-12-21 ENCOUNTER — Other Ambulatory Visit: Payer: Self-pay | Admitting: *Deleted

## 2016-12-21 ENCOUNTER — Encounter: Payer: Self-pay | Admitting: *Deleted

## 2016-12-22 ENCOUNTER — Encounter: Payer: Self-pay | Admitting: *Deleted

## 2016-12-22 ENCOUNTER — Ambulatory Visit
Admission: RE | Admit: 2016-12-22 | Discharge: 2016-12-22 | Disposition: A | Discharge status: Home / Self Care | Payer: Medicare Other | Source: Ambulatory Visit | Attending: Internal Medicine | Admitting: Internal Medicine

## 2016-12-22 ENCOUNTER — Encounter: Payer: Self-pay | Admitting: Internal Medicine

## 2016-12-22 ENCOUNTER — Inpatient Hospital Stay: Payer: Medicare Other | Attending: Internal Medicine | Admitting: Internal Medicine

## 2016-12-22 ENCOUNTER — Inpatient Hospital Stay: Payer: Medicare Other

## 2016-12-22 DIAGNOSIS — C7951 Secondary malignant neoplasm of bone: Secondary | ICD-10-CM | POA: Insufficient documentation

## 2016-12-22 DIAGNOSIS — Z8551 Personal history of malignant neoplasm of bladder: Secondary | ICD-10-CM | POA: Insufficient documentation

## 2016-12-22 DIAGNOSIS — C3412 Malignant neoplasm of upper lobe, left bronchus or lung: Secondary | ICD-10-CM

## 2016-12-22 DIAGNOSIS — K7460 Unspecified cirrhosis of liver: Secondary | ICD-10-CM | POA: Insufficient documentation

## 2016-12-22 DIAGNOSIS — R269 Unspecified abnormalities of gait and mobility: Secondary | ICD-10-CM | POA: Insufficient documentation

## 2016-12-22 DIAGNOSIS — Z87891 Personal history of nicotine dependence: Secondary | ICD-10-CM

## 2016-12-22 DIAGNOSIS — B182 Chronic viral hepatitis C: Secondary | ICD-10-CM | POA: Insufficient documentation

## 2016-12-22 DIAGNOSIS — N401 Enlarged prostate with lower urinary tract symptoms: Secondary | ICD-10-CM | POA: Insufficient documentation

## 2016-12-22 DIAGNOSIS — Z79899 Other long term (current) drug therapy: Secondary | ICD-10-CM

## 2016-12-22 DIAGNOSIS — Z8711 Personal history of peptic ulcer disease: Secondary | ICD-10-CM | POA: Insufficient documentation

## 2016-12-22 DIAGNOSIS — G893 Neoplasm related pain (acute) (chronic): Secondary | ICD-10-CM | POA: Diagnosis not present

## 2016-12-22 DIAGNOSIS — R161 Splenomegaly, not elsewhere classified: Secondary | ICD-10-CM

## 2016-12-22 LAB — CBC WITH DIFFERENTIAL/PLATELET
BASOS ABS: 0 10*3/uL (ref 0–0.1)
BASOS PCT: 1 %
EOS ABS: 0.1 10*3/uL (ref 0–0.7)
EOS PCT: 2 %
HCT: 34.2 % — ABNORMAL LOW (ref 40.0–52.0)
Hemoglobin: 12 g/dL — ABNORMAL LOW (ref 13.0–18.0)
Lymphocytes Relative: 11 %
Lymphs Abs: 0.6 10*3/uL — ABNORMAL LOW (ref 1.0–3.6)
MCH: 32.8 pg (ref 26.0–34.0)
MCHC: 35 g/dL (ref 32.0–36.0)
MCV: 93.9 fL (ref 80.0–100.0)
MONOS PCT: 10 %
Monocytes Absolute: 0.6 10*3/uL (ref 0.2–1.0)
Neutro Abs: 4.5 10*3/uL (ref 1.4–6.5)
Neutrophils Relative %: 76 %
PLATELETS: 68 10*3/uL — AB (ref 150–440)
RBC: 3.64 MIL/uL — ABNORMAL LOW (ref 4.40–5.90)
RDW: 13.3 % (ref 11.5–14.5)
WBC: 5.9 10*3/uL (ref 3.8–10.6)

## 2016-12-22 LAB — COMPREHENSIVE METABOLIC PANEL
ALBUMIN: 3.3 g/dL — AB (ref 3.5–5.0)
ALK PHOS: 84 U/L (ref 38–126)
ALT: 47 U/L (ref 17–63)
AST: 83 U/L — ABNORMAL HIGH (ref 15–41)
Anion gap: 8 (ref 5–15)
BILIRUBIN TOTAL: 1.6 mg/dL — AB (ref 0.3–1.2)
BUN: 42 mg/dL — AB (ref 6–20)
CALCIUM: 8.7 mg/dL — AB (ref 8.9–10.3)
CO2: 24 mmol/L (ref 22–32)
CREATININE: 1.55 mg/dL — AB (ref 0.61–1.24)
Chloride: 102 mmol/L (ref 101–111)
GFR calc Af Amer: 51 mL/min — ABNORMAL LOW (ref >60)
GFR calc non Af Amer: 44 mL/min — ABNORMAL LOW (ref >60)
GLUCOSE: 80 mg/dL (ref 65–99)
Potassium: 4.1 mmol/L (ref 3.5–5.1)
SODIUM: 134 mmol/L — AB (ref 135–145)
TOTAL PROTEIN: 6.7 g/dL (ref 6.5–8.1)

## 2016-12-22 LAB — PROTIME-INR
INR: 1.18
PROTHROMBIN TIME: 15.1 s (ref 11.4–15.2)

## 2016-12-22 LAB — APTT: APTT: 37 s — AB (ref 24–36)

## 2016-12-22 LAB — MAGNESIUM: Magnesium: 2.2 mg/dL (ref 1.7–2.4)

## 2016-12-22 LAB — LACTATE DEHYDROGENASE: LDH: 229 U/L — ABNORMAL HIGH (ref 98–192)

## 2016-12-22 MED ORDER — GADOBENATE DIMEGLUMINE 529 MG/ML IV SOLN
15.0000 mL | Freq: Once | INTRAVENOUS | Status: AC | PRN
Start: 2016-12-22 — End: 2016-12-22
  Administered 2016-12-22: 15 mL via INTRAVENOUS

## 2016-12-22 MED ORDER — FENTANYL 50 MCG/HR TD PT72: 50.0000 ug | MEDICATED_PATCH | TRANSDERMAL | 0 refills | Status: DC

## 2016-12-22 MED ORDER — OXYCODONE HCL 5 MG PO TABS: ORAL_TABLET | ORAL | 0 refills | Status: DC

## 2016-12-22 NOTE — Progress Notes (Signed)
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  male long-standing history of smoking who has recently moved from [place], [place] to live with his daughter has been referred to [place] for further evaluation and recommendations for lung nodules bone lesions.  Patient states That he noted to have pain in his right chest wall approximately one to 2 months ago when he was living in [place]. He went on to have a chest x-ray which was abnormal that led to a CAT scan in [place] that was suspicious for cancer.  Patient moved to live with his daughter to [place]. And he was then referred to pulmonary to call [facility] for further workup and recommendations. Patient had a PET scan recently. He has not had biopsy yet.  Patient stated that he has been very active all along. However in the last 1-2 weeks noted to have significant worsening of his right chest wall pain and also his left hip pain.   Denies any significant cough or shortness of breath. Denies any hemoptysis. Lost about 4 pounds in the last 4 days. Denies any hoarseness of voice. Complains of grade instability. Denies any headaches. Mild tingling and numbness of the right upper extremity. Patient's chronic swelling in the legs bilaterally. This is not any worse. He has been on fluid pills for many years.  ROS: A complete 10 point  review of system is done which is negative except mentioned above in history of present illness  MEDICAL HISTORY:  Past Medical History:  Diagnosis Date  . Benign non-nodular prostatic hyperplasia with lower urinary tract symptoms   . Bladder cancer ([place])   . Chronic hepatitis C without hepatic coma ([place])   . Cirrhosis of liver ([place])   . PUD (peptic ulcer disease)   . Rotator cuff tendinitis, right   . Spondylosis of cervical region without myelopathy or radiculopathy   . Tobacco abuse     SURGICAL HISTORY: Past Surgical History:  Procedure Laterality Date  . ABLATION LIVER TUMORS W/CRYOSURGICAL  2012  . CYSTOTOMY W/EXCISION BLADDER TUMOR  2012  . HERNIA REPAIR  1996  . REPAIR PERONEAL TENDONS ANKLE  1976    SOCIAL HISTORY: Quit smoking- 40ppd; no alcohhol now. [place];; [place] Social History   Social History  . Marital status: Widowed    Spouse name: N/A  . Number of children: N/A  . Years of education: N/A   Occupational History  . Not on file.   Social History Main Topics  . Smoking status: Former Smoker    Packs/day: 1.00    Years: 49.00    Types: Cigarettes  . Smokeless tobacco: Never Used  . Alcohol use No  . Drug use: No  . Sexual activity: Not on file   Other Topics Concern  . Not on file   Social History Narrative  . No narrative on  file    FAMILY HISTORY: Family History  Problem Relation Age of Onset  . Lung cancer Father   . Diabetes type II Maternal Grandmother   . Lung cancer Mother   . Mental illness Brother   . Other Brother     h/o substance abuse    ALLERGIES:  is allergic to iodine.  MEDICATIONS:  Current Outpatient Prescriptions  Medication Sig Dispense Refill  . furosemide (LASIX) 80 MG tablet Take 1 tablet by mouth daily.    Marland Kitchen lactulose (CHRONULAC) 10 GM/15ML solution Take 15 mLs by mouth daily as needed for constipation.    . meloxicam (MOBIC) 15 MG tablet Take 1 tablet by mouth daily.    . methadone (DOLOPHINE) 10  MG/5ML solution Take 95 mg by mouth daily.     Marland Kitchen spironolactone (ALDACTONE) 100 MG tablet Take 100 mg by mouth daily.     . tamsulosin (FLOMAX) 0.4 MG CAPS capsule Take 1 capsule by mouth daily.    . fentaNYL (DURAGESIC - DOSED MCG/HR) 50 MCG/HR Place 1 patch (50 mcg total) onto the skin every 3 (three) days. 5 patch 0  . oxyCODONE (OXY IR/ROXICODONE) 5 MG immediate release tablet One pill every 6-8 hours 40 tablet 0   No current facility-administered medications for this visit.       Marland Kitchen  PHYSICAL EXAMINATION: ECOG PERFORMANCE STATUS: 1 - Symptomatic but completely ambulatory  Vitals:   12/22/16 0844  BP: 116/63  Pulse: 70  Resp: 20  Temp: 97.6 F (36.4 C)   Filed Weights   12/22/16 0844  Weight: 176 lb (79.8 kg)    GENERAL: Well-nourished well-developed; Alert, no distress and comfortable.   Accompanied by his daughter. He is in a wheelchair. EYES: no pallor or icterus OROPHARYNX: no thrush or ulceration; good dentition  NECK: supple, no masses felt LYMPH:  no palpable lymphadenopathy in the cervical, axillary or inguinal regions LUNGS: clear to auscultation and  No wheeze or crackles HEART/CVS: regular rate & rhythm and no murmurs; positive for bilateral lower extremity edema ABDOMEN: abdomen soft, non-tender and normal bowel sounds; splenomegaly cannot be appreciated. Musculoskeletal:no cyanosis of digits and no clubbing  PSYCH: alert & oriented x 3 with fluent speech NEURO: no focal motor/sensory deficits SKIN:  Positive for spider angioma upper trunk. Chronic venous stasis changes noted bilateral lower extremities.  LABORATORY DATA:  I have reviewed the data as listed Lab Results  Component Value Date   WBC 5.9 12/22/2016   HGB 12.0 (L) 12/22/2016   HCT 34.2 (L) 12/22/2016   MCV 93.9 12/22/2016   PLT 68 (L) 12/22/2016    Recent Labs  12/22/16 0948  NA 134*  K 4.1  CL 102  CO2 24  GLUCOSE 80  BUN 42*  CREATININE 1.55*  CALCIUM 8.7*  GFRNONAA 44*   GFRAA 51*  PROT 6.7  ALBUMIN 3.3*  AST 83*  ALT 47  ALKPHOS 84  BILITOT 1.6*    RADIOGRAPHIC STUDIES: I have personally reviewed the radiological images as listed and agreed with the findings in the report. Mr Jeri Cos Wo Contrast  Result Date: 12/22/2016 CLINICAL DATA:  Recently diagnosed lung cancer.  Gait instability. EXAM: MRI HEAD WITHOUT AND WITH CONTRAST TECHNIQUE: Multiplanar, multiecho pulse sequences of the brain and surrounding structures were obtained without and with intravenous contrast. CONTRAST:  45m MULTIHANCE GADOBENATE DIMEGLUMINE 529 MG/ML IV SOLN COMPARISON:  None. FINDINGS: Brain: There is no evidence of acute infarct, intracranial hemorrhage, mass, midline shift, or extra-axial fluid collection.  The ventricles and sulci are within normal limits for age. Scattered punctate foci of cerebral white matter T2 hyperintensity are nonspecific but compatible with minimal chronic small vessel ischemic disease. No abnormal enhancement is identified. Vascular: Major intracranial vascular flow voids are preserved. Skull and upper cervical spine: Prior biparietal craniectomy. Patchy regions of heterogeneously diminished bone marrow signal intensity, particularly posteriorly in the skull as well as in the upper cervical spine. No destructive osseous lesion identified. Sinuses/Orbits: Unremarkable orbits. Paranasal sinuses and mastoid air cells are clear. Other: None. IMPRESSION: 1. No evidence of intracranial metastatic disease. 2. Heterogeneous marrow signal in the skull and upper cervical spine, nonspecific. Osseous metastatic disease not excluded. No destructive osseous lesion. Electronically Signed   By: Logan Bores M.D.   On: 12/22/2016 13:07   Nm Pet Image Initial (pi) Skull Base To Thigh  Result Date: 12/18/2016 CLINICAL DATA:  Initial treatment strategy for lung mass. EXAM: NUCLEAR MEDICINE PET SKULL BASE TO THIGH TECHNIQUE: 12.4 mCi F-18 FDG was injected intravenously. Full-ring  PET imaging was performed from the skull base to thigh after the radiotracer. CT data was obtained and used for attenuation correction and anatomic localization. FASTING BLOOD GLUCOSE:  Value: 91 mg/dl COMPARISON:  None. FINDINGS: NECK No hypermetabolic lymph nodes in the neck. CHEST Dominant mass in the LEFT upper lobe measures 4.1 cm with SUV max equal 16.5. Nodule in the LEFT upper lobe measures 2.9 cm (image 56, series 3) with SUV max equal 4.5. Hypermetabolic LEFT hilar lymph node with SUV max equal 6.0. Smaller nodule in the LEFT lower lobe measures 10 mm (image 85, series 5) without clear metabolic activity. Similar nodule in the RIGHT lower lobe measuring 7 mm (image 87, series 3) with trace metabolic activity. Nodule over the RIGHT hemidiaphragm measures 9 mm. ABDOMEN/PELVIS No abnormal hypermetabolic activity within the liver, pancreas, adrenal glands, or spleen. No hypermetabolic lymph nodes in the abdomen or pelvis. The liver has a shrunken nodular contour with enlargement of the caudate lobe. Spleen is enlarged. Small LEFT bladder diverticulum. SKELETON Hypermetabolic destructive lesion within the RIGHT pedicle of the T2 vertebral body with SUV max equal 6.4. Expansile lesion within the anterior RIGHT third rib measures 5 by 3.9 cm with intense metabolic activity. Distant skeletal metastasis within the LEFT femoral neck with SUV max equals 7.3. IMPRESSION: 1. Intense hypermetabolic LEFT upper lobe mass consists with primary bronchogenic carcinoma. 2. Large hypermetabolic nodule in the LEFT upper lobe. Favor metastatic disease. 3. Multiple bilateral lower lobe pulmonary nodules without metabolic activity concerning for pulmonary metastasis. 4. Hypermetabolic LEFT hilar nodal metastasis. 5. Hypermetabolic expansile skeletal metastasis including the RIGHT vertebral body pedicle at T2 and an anterior RIGHT rib. More distant skeletal metastasis in the LEFT femur neck. 6. Splenomegaly and liver cirrhosis.  These results will be called to the ordering clinician or representative by the Radiologist Assistant, and communication documented in the PACS or zVision Dashboard. Electronically Signed   By: Suzy Bouchard M.D.   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 Right chest wall lesion most amenable to biopsy. Reviewed with radiology/the tumor conference. Recommend biopsy ASAP. Had a long discussion the patient and his daughter- we'll need to await the biopsy to discuss prognosis; in general chemotherapy immunotherapy would be the treatment options.  # Pain control-secondary to bony metastasis. Recommend fentanyl patch 50;  oxycodone 5 every 4-6 hours.[ Patient not to start these until tomorrow; when he needs to be weaned off his methadone]. Recommend Xgeva- starting at next visit. Patient is awaiting radiation oncology evaluation early next week.  Gait instability- MRI of the brain negative for metastases.   #History of cirrhosis and splenomegaly-seems to be compensated. recommend PT PTT. CBC CMP.  # Had a long discussion the patient and his daughter regarding the seriousness of his illness so far. And also history of cirrhosis splenomegaly- would have an impact of how we treat him with systemic therapy.     # Patient follow-up with me in few days post biopsy/Xgeva. Discussed with Lung Nurse navigator.    # I reviewed the blood work- with the patient in detail; also reviewed the imaging independently [as summarized above]; and with the patient in detail.   Thank you Dr. Raul Del for allowing me to participate in the care of your pleasant patient. Please do not hesitate to contact me with questions or concerns in the interim.  All questions were answered. The patient knows to call the clinic with any problems, questions or concerns.     Cammie Sickle,  MD 12/22/2016 5:23 PM

## 2016-12-22 NOTE — Assessment & Plan Note (Addendum)
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 Right chest wall lesion most amenable to biopsy. Reviewed with radiology/the tumor conference. Recommend biopsy ASAP. Had a long discussion the patient and his daughter- we'll need to await the biopsy to discuss prognosis; in general chemotherapy immunotherapy would be the treatment options.  # Pain control-secondary to bony metastasis. Recommend fentanyl patch 50; oxycodone 5 every 4-6 hours.[ Patient not to start these until tomorrow; when he needs to be weaned off his methadone]. Recommend Xgeva- starting at next visit. Patient is awaiting radiation oncology evaluation early next week.  Gait instability- MRI of the brain negative for metastases.   #History of cirrhosis and splenomegaly-seems to be compensated. recommend PT PTT. CBC CMP.  # Had a long discussion the patient and his daughter regarding the seriousness of his illness so far. And also history of cirrhosis splenomegaly- would have an impact of how we treat him with systemic therapy.     # Patient follow-up with me in few days post biopsy/Xgeva. Discussed with Lung Nurse navigator.    # I reviewed the blood work- with the patient in detail; also reviewed the imaging independently [as summarized above]; and with the patient in detail.   Thank you Dr. Raul Del for allowing me to participate in the care of your pleasant patient. Please do not hesitate to contact me with questions or concerns in the interim.

## 2016-12-22 NOTE — Progress Notes (Signed)
  Oncology Nurse Navigator Documentation  Navigator Location: [facility] (12/22/16 1000) Referral date to RadOnc/MedOnc: 12/20/16 (12/22/16 1000) )Navigator Encounter Type: Clinic/MDC (12/22/16 1000)   Abnormal Finding Date: 12/18/16 (12/22/16 1000)           Multidisiplinary Clinic Date: 12/22/16 (12/22/16 1000) Multidisiplinary Clinic Type: Thoracic (12/22/16 1000)     Treatment Phase: Abnormal Scans (12/22/16 1000) Barriers/Navigation Needs: No barriers at this time;No Questions (12/22/16 1000)   Interventions: Coordination of Care;Referrals (12/22/16 1000) Referrals: Other (radiation oncology) (12/22/16 1000) Coordination of Care: Appts;Radiology (CT biopsy) (12/22/16 1000)      Specialty Items/DME:  (walker, handicap placard) (12/22/16 1000) Acuity: Level 2 (12/22/16 1000)   Acuity Level 2: Initial guidance, education and coordination as needed;Assistance expediting appointments (12/22/16 1000)  Met with patient during [place] with Dr. Rogue Bussing. Pt accompanied with daughter, [name]. [name] voiced needing assistance with getting a walker, handicap placard, and pain medications. Prescription for walker given to pt and form signed for handicap placard. Pt was given prescription for pain medication. Pt receives treatment at methadone clinic currently. Spoke with Roy Ripple, NP at [facility] [address] in [place], [place] (ph# [phone]) who is aware pt will start pain medication and their clinic to give patient half dose of methadone at this time. Pt was instructed to not take pain medication until tomorrow after lunch per [facility] instructions since he had his dose of methadone this AM. Pt verbalized understanding. All appts expedited since pt is symptomatic. Awaiting biopsy to be scheduled at this time. Pt was informed that I will give him a call with that appointment. Pt and pt's daughter voiced understanding and had no further questions at this time.   Time Spent  with Patient: 60 (12/22/16 1000)

## 2016-12-22 NOTE — Progress Notes (Signed)
Patient here for new consult with Dr. Rogue Bussing - newly dx with lung mass. Patient c/o left pain. Patient states that last Tuesday he was able to walk up and down his flight of stairs at home. His quality of life has changed since that date since the acute onset of his left hip pain. Rates pain 3/10 with sitting; however, when pt ambulated the pain intensfies to 7/10. Pt [facility]. Pt states that the methadone dosing is not effective.   Pt notes that his legs are swollen and heavy bilateral leg edema. 3+ pitting edema noted in bil. Lower extremities.

## 2016-12-23 LAB — CEA: CEA: 9.1 ng/mL — AB (ref 0.0–4.7)

## 2016-12-25 ENCOUNTER — Ambulatory Visit
Admission: RE | Admit: 2016-12-25 | Discharge: 2016-12-25 | Disposition: A | Discharge status: Home / Self Care | Payer: Medicare Other | Source: Ambulatory Visit | Attending: Radiation Oncology | Admitting: Radiation Oncology

## 2016-12-25 ENCOUNTER — Inpatient Hospital Stay
Admission: EM | Admit: 2016-12-25 | Discharge: 2016-12-28 | DRG: 470 | Disposition: A | Discharge status: Home Health | Payer: Medicare Other | Attending: Internal Medicine | Admitting: Internal Medicine

## 2016-12-25 ENCOUNTER — Emergency Department: Payer: Medicare Other

## 2016-12-25 ENCOUNTER — Other Ambulatory Visit: Payer: Self-pay | Admitting: Radiology

## 2016-12-25 ENCOUNTER — Telehealth: Payer: Self-pay | Admitting: *Deleted

## 2016-12-25 ENCOUNTER — Encounter: Payer: Self-pay | Admitting: Emergency Medicine

## 2016-12-25 ENCOUNTER — Other Ambulatory Visit: Payer: Self-pay

## 2016-12-25 ENCOUNTER — Other Ambulatory Visit: Payer: Self-pay | Admitting: *Deleted

## 2016-12-25 ENCOUNTER — Encounter: Payer: Self-pay | Admitting: Radiation Oncology

## 2016-12-25 VITALS — BP 119/66 | HR 72 | Temp 98.2°F | Resp 18

## 2016-12-25 DIAGNOSIS — C7951 Secondary malignant neoplasm of bone: Secondary | ICD-10-CM

## 2016-12-25 DIAGNOSIS — Z801 Family history of malignant neoplasm of trachea, bronchus and lung: Secondary | ICD-10-CM

## 2016-12-25 DIAGNOSIS — Z87891 Personal history of nicotine dependence: Secondary | ICD-10-CM | POA: Insufficient documentation

## 2016-12-25 DIAGNOSIS — Z7952 Long term (current) use of systemic steroids: Secondary | ICD-10-CM | POA: Diagnosis not present

## 2016-12-25 DIAGNOSIS — Z814 Family history of other substance abuse and dependence: Secondary | ICD-10-CM

## 2016-12-25 DIAGNOSIS — G893 Neoplasm related pain (acute) (chronic): Secondary | ICD-10-CM | POA: Diagnosis not present

## 2016-12-25 DIAGNOSIS — C801 Malignant (primary) neoplasm, unspecified: Secondary | ICD-10-CM

## 2016-12-25 DIAGNOSIS — Z79899 Other long term (current) drug therapy: Secondary | ICD-10-CM

## 2016-12-25 DIAGNOSIS — K7460 Unspecified cirrhosis of liver: Secondary | ICD-10-CM | POA: Diagnosis present

## 2016-12-25 DIAGNOSIS — N401 Enlarged prostate with lower urinary tract symptoms: Secondary | ICD-10-CM | POA: Diagnosis not present

## 2016-12-25 DIAGNOSIS — Z818 Family history of other mental and behavioral disorders: Secondary | ICD-10-CM

## 2016-12-25 DIAGNOSIS — C3412 Malignant neoplasm of upper lobe, left bronchus or lung: Secondary | ICD-10-CM | POA: Diagnosis present

## 2016-12-25 DIAGNOSIS — Z8551 Personal history of malignant neoplasm of bladder: Secondary | ICD-10-CM | POA: Diagnosis not present

## 2016-12-25 DIAGNOSIS — D62 Acute posthemorrhagic anemia: Secondary | ICD-10-CM | POA: Diagnosis not present

## 2016-12-25 DIAGNOSIS — Z791 Long term (current) use of non-steroidal anti-inflammatories (NSAID): Secondary | ICD-10-CM | POA: Diagnosis not present

## 2016-12-25 DIAGNOSIS — Z8711 Personal history of peptic ulcer disease: Secondary | ICD-10-CM | POA: Diagnosis not present

## 2016-12-25 DIAGNOSIS — M84452A Pathological fracture, left femur, initial encounter for fracture: Secondary | ICD-10-CM

## 2016-12-25 DIAGNOSIS — Z91048 Other nonmedicinal substance allergy status: Secondary | ICD-10-CM | POA: Diagnosis not present

## 2016-12-25 DIAGNOSIS — Z510 Encounter for antineoplastic radiation therapy: Secondary | ICD-10-CM | POA: Insufficient documentation

## 2016-12-25 DIAGNOSIS — Z66 Do not resuscitate: Secondary | ICD-10-CM | POA: Diagnosis not present

## 2016-12-25 DIAGNOSIS — C3411 Malignant neoplasm of upper lobe, right bronchus or lung: Secondary | ICD-10-CM | POA: Insufficient documentation

## 2016-12-25 DIAGNOSIS — B182 Chronic viral hepatitis C: Secondary | ICD-10-CM | POA: Diagnosis present

## 2016-12-25 DIAGNOSIS — M84559A Pathological fracture in neoplastic disease, hip, unspecified, initial encounter for fracture: Secondary | ICD-10-CM | POA: Diagnosis present

## 2016-12-25 DIAGNOSIS — D61818 Other pancytopenia: Secondary | ICD-10-CM | POA: Diagnosis present

## 2016-12-25 DIAGNOSIS — Z833 Family history of diabetes mellitus: Secondary | ICD-10-CM

## 2016-12-25 DIAGNOSIS — B1920 Unspecified viral hepatitis C without hepatic coma: Secondary | ICD-10-CM | POA: Insufficient documentation

## 2016-12-25 DIAGNOSIS — K279 Peptic ulcer, site unspecified, unspecified as acute or chronic, without hemorrhage or perforation: Secondary | ICD-10-CM | POA: Insufficient documentation

## 2016-12-25 DIAGNOSIS — R161 Splenomegaly, not elsewhere classified: Secondary | ICD-10-CM | POA: Diagnosis not present

## 2016-12-25 DIAGNOSIS — N400 Enlarged prostate without lower urinary tract symptoms: Secondary | ICD-10-CM | POA: Insufficient documentation

## 2016-12-25 DIAGNOSIS — F1120 Opioid dependence, uncomplicated: Secondary | ICD-10-CM | POA: Diagnosis present

## 2016-12-25 DIAGNOSIS — E871 Hypo-osmolality and hyponatremia: Secondary | ICD-10-CM | POA: Diagnosis present

## 2016-12-25 DIAGNOSIS — G8918 Other acute postprocedural pain: Secondary | ICD-10-CM

## 2016-12-25 DIAGNOSIS — R918 Other nonspecific abnormal finding of lung field: Secondary | ICD-10-CM | POA: Diagnosis not present

## 2016-12-25 DIAGNOSIS — M479 Spondylosis, unspecified: Secondary | ICD-10-CM | POA: Insufficient documentation

## 2016-12-25 DIAGNOSIS — S72002A Fracture of unspecified part of neck of left femur, initial encounter for closed fracture: Secondary | ICD-10-CM | POA: Diagnosis present

## 2016-12-25 DIAGNOSIS — S72009A Fracture of unspecified part of neck of unspecified femur, initial encounter for closed fracture: Secondary | ICD-10-CM | POA: Diagnosis present

## 2016-12-25 LAB — HEPATIC FUNCTION PANEL
ALT: 55 U/L (ref 17–63)
AST: 93 U/L — AB (ref 15–41)
Albumin: 3.3 g/dL — ABNORMAL LOW (ref 3.5–5.0)
Alkaline Phosphatase: 92 U/L (ref 38–126)
BILIRUBIN DIRECT: 0.7 mg/dL — AB (ref 0.1–0.5)
Indirect Bilirubin: 1.3 mg/dL — ABNORMAL HIGH (ref 0.3–0.9)
Total Bilirubin: 2 mg/dL — ABNORMAL HIGH (ref 0.3–1.2)
Total Protein: 7 g/dL (ref 6.5–8.1)

## 2016-12-25 LAB — BASIC METABOLIC PANEL
ANION GAP: 9 (ref 5–15)
BUN: 29 mg/dL — ABNORMAL HIGH (ref 6–20)
CO2: 25 mmol/L (ref 22–32)
CREATININE: 1.35 mg/dL — AB (ref 0.61–1.24)
Calcium: 9 mg/dL (ref 8.9–10.3)
Chloride: 99 mmol/L — ABNORMAL LOW (ref 101–111)
GFR, EST NON AFRICAN AMERICAN: 52 mL/min — AB (ref >60)
Glucose, Bld: 97 mg/dL (ref 65–99)
Potassium: 3.9 mmol/L (ref 3.5–5.1)
SODIUM: 133 mmol/L — AB (ref 135–145)

## 2016-12-25 LAB — APTT: APTT: 37 s — AB (ref 24–36)

## 2016-12-25 LAB — CBC
HCT: 37.1 % — ABNORMAL LOW (ref 40.0–52.0)
Hemoglobin: 12.9 g/dL — ABNORMAL LOW (ref 13.0–18.0)
MCH: 33.1 pg (ref 26.0–34.0)
MCHC: 34.6 g/dL (ref 32.0–36.0)
MCV: 95.5 fL (ref 80.0–100.0)
Platelets: 75 10*3/uL — ABNORMAL LOW (ref 150–440)
RBC: 3.89 MIL/uL — ABNORMAL LOW (ref 4.40–5.90)
RDW: 13 % (ref 11.5–14.5)
WBC: 5.2 10*3/uL (ref 3.8–10.6)

## 2016-12-25 LAB — PROTIME-INR
INR: 1.12
PROTHROMBIN TIME: 14.5 s (ref 11.4–15.2)

## 2016-12-25 LAB — GLUCOSE, CAPILLARY: GLUCOSE-CAPILLARY: 88 mg/dL (ref 65–99)

## 2016-12-25 LAB — TYPE AND SCREEN
ABO/RH(D): O POS
Antibody Screen: NEGATIVE

## 2016-12-25 LAB — SURGICAL PCR SCREEN
MRSA, PCR: NEGATIVE
STAPHYLOCOCCUS AUREUS: NEGATIVE

## 2016-12-25 MED ORDER — MELOXICAM 7.5 MG PO TABS
15.0000 mg | ORAL_TABLET | Freq: Every day | ORAL | Status: DC
  Administered 2016-12-27 – 2016-12-28 (×2): 15 mg via ORAL
  Filled 2016-12-25: qty 1
  Filled 2016-12-25 (×2): qty 2

## 2016-12-25 MED ORDER — CALCIUM CARBONATE ANTACID 500 MG PO CHEW
2.0000 | CHEWABLE_TABLET | Freq: Two times a day (BID) | ORAL | Status: DC
  Administered 2016-12-27 – 2016-12-28 (×3): 400 mg via ORAL
  Filled 2016-12-25 (×3): qty 2

## 2016-12-25 MED ORDER — SPIRONOLACTONE 25 MG PO TABS
100.0000 mg | ORAL_TABLET | Freq: Every day | ORAL | Status: DC
  Administered 2016-12-28: 100 mg via ORAL
  Filled 2016-12-25: qty 1
  Filled 2016-12-25 (×2): qty 4

## 2016-12-25 MED ORDER — HYDROMORPHONE HCL 1 MG/ML IJ SOLN
1.0000 mg | INTRAMUSCULAR | Status: DC | PRN
  Administered 2016-12-25: 1 mg via INTRAVENOUS
  Administered 2016-12-26: 0.5 mg via INTRAVENOUS
  Administered 2016-12-26 (×5): 1 mg via INTRAVENOUS
  Administered 2016-12-26: 0.5 mg via INTRAVENOUS
  Filled 2016-12-25 (×4): qty 1

## 2016-12-25 MED ORDER — SODIUM CHLORIDE 0.9 % IV SOLN
INTRAVENOUS | Status: DC
  Administered 2016-12-25 – 2016-12-26 (×2): via INTRAVENOUS

## 2016-12-25 MED ORDER — ACETAMINOPHEN 650 MG RE SUPP: 650.0000 mg | Freq: Four times a day (QID) | RECTAL | Status: DC | PRN

## 2016-12-25 MED ORDER — TAMSULOSIN HCL 0.4 MG PO CAPS
0.4000 mg | ORAL_CAPSULE | Freq: Every day | ORAL | Status: DC
  Administered 2016-12-27 – 2016-12-28 (×2): 0.4 mg via ORAL
  Filled 2016-12-25 (×2): qty 1

## 2016-12-25 MED ORDER — DEXAMETHASONE 4 MG PO TABS
4.0000 mg | ORAL_TABLET | Freq: Two times a day (BID) | ORAL | Status: DC
  Administered 2016-12-25 – 2016-12-28 (×4): 4 mg via ORAL
  Filled 2016-12-25 (×5): qty 1

## 2016-12-25 MED ORDER — FENTANYL 100 MCG/HR TD PT72: 100.0000 ug | MEDICATED_PATCH | TRANSDERMAL | 0 refills | Status: DC

## 2016-12-25 MED ORDER — SENNA 8.6 MG PO TABS
1.0000 | ORAL_TABLET | Freq: Two times a day (BID) | ORAL | Status: DC
  Administered 2016-12-25 – 2016-12-28 (×5): 8.6 mg via ORAL
  Filled 2016-12-25 (×5): qty 1

## 2016-12-25 MED ORDER — DOCUSATE SODIUM 100 MG PO CAPS
100.0000 mg | ORAL_CAPSULE | Freq: Two times a day (BID) | ORAL | Status: DC
  Administered 2016-12-25 – 2016-12-28 (×5): 100 mg via ORAL
  Filled 2016-12-25 (×5): qty 1

## 2016-12-25 MED ORDER — DEXAMETHASONE 4 MG PO TABS: 4.0000 mg | ORAL_TABLET | Freq: Two times a day (BID) | ORAL | 0 refills | Status: DC

## 2016-12-25 MED ORDER — FENTANYL CITRATE (PF) 100 MCG/2ML IJ SOLN
25.0000 ug | INTRAMUSCULAR | Status: DC | PRN
  Administered 2016-12-26: 100 ug via INTRAVENOUS

## 2016-12-25 MED ORDER — HYDROMORPHONE HCL 1 MG/ML IJ SOLN
1.0000 mg | Freq: Once | INTRAMUSCULAR | Status: AC
  Administered 2016-12-25: 1 mg via INTRAVENOUS
  Filled 2016-12-25: qty 1

## 2016-12-25 MED ORDER — ONDANSETRON HCL 4 MG PO TABS: 4.0000 mg | ORAL_TABLET | Freq: Four times a day (QID) | ORAL | Status: DC | PRN

## 2016-12-25 MED ORDER — METHADONE HCL 10 MG/ML PO CONC
50.0000 mg | Freq: Every day | ORAL | Status: DC
  Administered 2016-12-26 – 2016-12-28 (×3): 50 mg via ORAL
  Filled 2016-12-25 (×3): qty 5

## 2016-12-25 MED ORDER — LACTULOSE 10 GM/15ML PO SOLN
10.0000 g | Freq: Every day | ORAL | Status: DC
  Administered 2016-12-27 – 2016-12-28 (×2): 10 g via ORAL
  Filled 2016-12-25 (×2): qty 30

## 2016-12-25 MED ORDER — SODIUM CHLORIDE 0.9 % IV SOLN
Freq: Once | INTRAVENOUS | Status: AC
  Administered 2016-12-25: 17:00:00 via INTRAVENOUS

## 2016-12-25 MED ORDER — ACETAMINOPHEN 325 MG PO TABS: 650.0000 mg | ORAL_TABLET | Freq: Four times a day (QID) | ORAL | Status: DC | PRN

## 2016-12-25 MED ORDER — FUROSEMIDE 40 MG PO TABS
80.0000 mg | ORAL_TABLET | Freq: Every day | ORAL | Status: DC
  Administered 2016-12-28: 80 mg via ORAL
  Filled 2016-12-25 (×2): qty 2

## 2016-12-25 MED ORDER — METHADONE HCL 10 MG/ML PO CONC: 95.0000 mg | Freq: Every day | ORAL | Status: DC

## 2016-12-25 MED ORDER — KETOROLAC TROMETHAMINE 15 MG/ML IJ SOLN
15.0000 mg | Freq: Four times a day (QID) | INTRAMUSCULAR | Status: DC | PRN
  Administered 2016-12-25 – 2016-12-26 (×2): 15 mg via INTRAVENOUS
  Filled 2016-12-25 (×2): qty 1

## 2016-12-25 MED ORDER — FLEET ENEMA 7-19 GM/118ML RE ENEM: 1.0000 | ENEMA | Freq: Once | RECTAL | Status: DC | PRN

## 2016-12-25 MED ORDER — FENTANYL 50 MCG/HR TD PT72
100.0000 ug | MEDICATED_PATCH | TRANSDERMAL | Status: DC
Start: 2016-12-28 — End: 2016-12-27

## 2016-12-25 MED ORDER — ONDANSETRON HCL 4 MG/2ML IJ SOLN
4.0000 mg | Freq: Four times a day (QID) | INTRAMUSCULAR | Status: DC | PRN
Start: 2016-12-25 — End: 2016-12-28
  Administered 2016-12-26: 4 mg via INTRAVENOUS

## 2016-12-25 MED ORDER — CEFAZOLIN IN D5W 1 GM/50ML IV SOLN
1.0000 g | Freq: Once | INTRAVENOUS | Status: DC
  Filled 2016-12-25: qty 50

## 2016-12-25 NOTE — H&P (Signed)
[facility] NAME: [name]    MR#:  [id number]  DATE OF BIRTH:  [birth year]  DATE OF ADMISSION:  12/25/2016  PRIMARY CARE PHYSICIAN: [name], Roy Bis, [name]   REQUESTING/REFERRING PHYSICIAN: dr Alfred [name]  CHIEF COMPLAINT:   Hip pain HISTORY OF PRESENT ILLNESS:  [name]  is a 71 y.o. male with a Recent diagnosis stage IV metastatic lung cancer not yet on chemo/radiation who presented to [place] office for initial evaluation. Patient was complaining of left hip pain and was very tender on examination. [name] ordered x-ray which confirmed left hip fracture. She denies falls. Patient reports over the past 4-5 days he has had increasing left hip pain and is unable to walk on left hip. Currently pain is 8 out of 10. He is on 200 g of fentanyl and is being weaned off of methadone. He received IV fentanyl here and states it is not working. Patient reports his pain is located on the left hip and worse with weightbearing and movement and has been constant. PAST MEDICAL HISTORY:   Past Medical History:  Diagnosis Date  . Benign non-nodular prostatic hyperplasia with lower urinary tract symptoms   . Bladder cancer ([place])   . Chronic hepatitis C without hepatic coma ([place])   . Cirrhosis of liver ([place])   . PUD (peptic ulcer disease)   . Rotator cuff tendinitis, right   . Spondylosis of cervical region without myelopathy or radiculopathy   . Tobacco abuse   Stage IV lung cancer with metastatic disease  PAST SURGICAL HISTORY:   Past Surgical History:  Procedure Laterality Date  . ABLATION LIVER TUMORS W/CRYOSURGICAL  2012  . CYSTOTOMY W/EXCISION BLADDER TUMOR  2012  . HERNIA REPAIR  1996  . REPAIR PERONEAL TENDONS ANKLE  1976    SOCIAL HISTORY:   Social History  Substance Use Topics  . Smoking status: Former Smoker    Packs/day: 1.00    Years: 49.00    Types: Cigarettes  . Smokeless tobacco: Never Used  .  Alcohol use No    FAMILY HISTORY:   Family History  Problem Relation Age of Onset  . Lung cancer Father   . Diabetes type II Maternal Grandmother   . Lung cancer Mother   . Mental illness Brother   . Other Brother     h/o substance abuse    DRUG ALLERGIES:   Allergies  Allergen Reactions  . Iodine Hives    REVIEW OF SYSTEMS:   Review of Systems  Constitutional: Positive for malaise/fatigue and weight loss. Negative for chills and fever.  HENT: Negative.  Negative for ear discharge, ear pain, hearing loss, nosebleeds and sore throat.   Eyes: Negative.  Negative for blurred vision and pain.  Respiratory: Negative.  Negative for cough, hemoptysis, shortness of breath and wheezing.   Cardiovascular: Negative.  Negative for chest pain, palpitations and leg swelling.  Gastrointestinal: Negative.  Negative for abdominal pain, blood in stool, diarrhea, nausea and vomiting.  Genitourinary: Negative.  Negative for dysuria.  Musculoskeletal: Positive for joint pain. Negative for back pain.  Skin: Negative.   Neurological: Positive for weakness. Negative for dizziness, tremors, speech change, focal weakness, seizures and headaches.  Endo/Heme/Allergies: Negative.  Does not bruise/bleed easily.  Psychiatric/Behavioral: Negative.  Negative for depression, hallucinations and suicidal ideas.    MEDICATIONS AT HOME:   Prior to Admission medications   Medication Sig Start Date End Date Taking? Authorizing Provider  dexamethasone (DECADRON)  4 MG tablet Take 1 tablet (4 mg total) by mouth 2 (two) times daily. 12/25/16  Yes Roy Sickle, [name]  fentaNYL (DURAGESIC - DOSED MCG/HR) 100 MCG/HR Place 1 patch (100 mcg total) onto the skin every 3 (three) days. 12/25/16  Yes Roy Sickle, [name]  furosemide (LASIX) 80 MG tablet Take 1 tablet by mouth daily. 01/28/16  Yes Historical Provider, [name]  lactulose (CHRONULAC) 10 GM/15ML solution Take 15 mLs by mouth daily as needed for constipation.    Yes Historical Provider, [name]  meloxicam (MOBIC) 15 MG tablet Take 1 tablet by mouth daily. 12/12/16  Yes Historical Provider, [name]  methadone (DOLOPHINE) 10 MG/5ML solution Take 95 mg by mouth daily.    Yes Historical Provider, [name]  oxyCODONE (OXY IR/ROXICODONE) 5 MG immediate release tablet One pill every 6-8 hours 12/22/16  Yes Roy Sickle, [name]  spironolactone (ALDACTONE) 100 MG tablet Take 100 mg by mouth daily.  09/21/15  Yes Historical Provider, [name]  tamsulosin (FLOMAX) 0.4 MG CAPS capsule Take 1 capsule by mouth daily. 01/28/16  Yes Historical Provider, [name]      VITAL SIGNS:  Blood pressure 130/69, pulse 74, temperature 97.7 F (36.5 C), temperature source Oral, resp. rate 17, height '5\' 9"'$  (1.753 m), weight 79.4 kg (175 lb), SpO2 100 %.  PHYSICAL EXAMINATION:   Physical Exam  Constitutional: He is oriented to person, place, and time. He appears distressed (From hip pain).  Frail appearing  HENT:  Head: Normocephalic.  Eyes: No scleral icterus.  Neck: Normal range of motion. Neck supple. No JVD present. No tracheal deviation present.  Cardiovascular: Normal rate, regular rhythm and normal heart sounds.  Exam reveals no gallop and no friction rub.   No murmur heard. Pulmonary/Chest: Effort normal and breath sounds normal. No respiratory distress. He has no wheezes. He has no rales. He exhibits no tenderness.  Abdominal: Soft. Bowel sounds are normal. He exhibits no distension and no mass. There is no tenderness. There is no rebound and no guarding.  Musculoskeletal: He exhibits no edema.  Due to hip fracture patient unable to move left hip  Neurological: He is alert and oriented to person, place, and time.  Skin: Skin is warm. No rash noted. No erythema.  Psychiatric: Affect and judgment normal.      LABORATORY PANEL:   CBC  Recent Labs Lab 12/25/16 1648  WBC 5.2  HGB 12.9*  HCT 37.1*  PLT 75*    ------------------------------------------------------------------------------------------------------------------  Chemistries   Recent Labs Lab 12/22/16 0948 12/25/16 1648  NA 134* 133*  K 4.1 3.9  CL 102 99*  CO2 24 25  GLUCOSE 80 97  BUN 42* 29*  CREATININE 1.55* 1.35*  CALCIUM 8.7* 9.0  MG 2.2  --   AST 83*  --   ALT 47  --   ALKPHOS 84  --   BILITOT 1.6*  --    ------------------------------------------------------------------------------------------------------------------  Cardiac Enzymes No results for input(s): TROPONINI in the last 168 hours. ------------------------------------------------------------------------------------------------------------------  RADIOLOGY:  Dg Chest Portable 1 View  Result Date: 12/25/2016 CLINICAL DATA:  Preop for left hip fracture. EXAM: PORTABLE CHEST 1 VIEW COMPARISON:  Head CT 12/18/2016 FINDINGS: 2 left lung mass and right third rib metastasis with pleural disease, known from recent PET-CT. No cardiomegaly. There is no edema, consolidation, effusion, or pneumothorax. No acute fracture IMPRESSION: 1. No acute finding. 2. Known left lung masses and right third rib metastasis. Electronically Signed   By: Monte Fantasia M.D.   On:  12/25/2016 17:16   Dg Hip Unilat With Pelvis 2-3 Views Left  Result Date: 12/25/2016 CLINICAL DATA:  Fall 1 week ago with subsequent pain. Initial encounter. EXAM: DG HIP (WITH OR WITHOUT PELVIS) 2-3V LEFT COMPARISON:  PET-CT 12/18/2016 FINDINGS: Transcervical left femoral neck fracture, acute appearing, with 9 mm of displacement. This is pathologic by prior PET-CT. No dislocation. No visible pelvic ring fracture. IMPRESSION: Displaced left femoral neck fracture, pathologic based on PET-CT 12/18/2016 Electronically Signed   By: Monte Fantasia M.D.   On: 12/25/2016 17:19    EKG:   Sinus rhythm no st depression/elevation  IMPRESSION AND PLAN:   71 year old male with recent diagnosis stage IV metastatic  lung cancer who presented to [name] office for initial consultation and wascomplaining of left hip pain and now found to have left hip fracture.  1. Preoperative clearance for Acute left hip fracture, nontraumatic: Patient is at moderate risk for moderate risk procedure. The biggest issue with be pain control. He needs daily stood softners in addition to pain managment Repeat a consultation requested. Pain control Start calcium supplementation   2. Mild hyponatremia in the setting of lung cancer and poor by mouth intake IV fluids and repeat BMP in a.m.  3. Chronic hepatitis C with liver cirrhosis/splenomegaly:  Continue lactulose, Lasix and Aldactone  Monitor BMP   4. BPH: Continue Flomax   5. History of IV drug use and chronic pain: Continue fentanyl and methadone   6. Stage IV lung cancer with metastatic disease:  Oncology evaluation   All the records are reviewed and case discussed with ED provider. Management plans discussed with the patient and he is in agreement  CODE STATUS: DNR  TOTAL TIME TAKING CARE OF THIS PATIENT: 45 minutes.    [name] M.D on 12/25/2016 at 5:27 PM  Between 7am to 6pm - Pager - [phone]  After 6pm go to [email or web address] - password [email or web address]  Office  [phone]  CC: Primary care physician; [name], Roy Bis, [name]     n

## 2016-12-25 NOTE — Consult Note (Signed)
71 yo WM with apparent metastatic lung ca fell a week ago, has severe hip pain and admitted with left femoral neck fracture.  PET scan positive in the affected area a week ago. PE: LLE slightly shortened, 1+ edema, able to flex and extend toes.   Xray: displaced femoral neck fracture without significant DJD  Impression: left femoral neck fracture, presumed pathologic. Plan: left hip hemiarthroplasty, femoral head and neck to pathology for analysis

## 2016-12-25 NOTE — Telephone Encounter (Signed)
Pt's daughter called to inform us that the pt has had increased pain all weekend, requiring more pain medication than prescribed. Asks if can get prescription for wheelchair and if Dr. B could write prescription for methadone so the patient does not have to travel to the methadone clinic daily.   Spoke with Dr. Rogue Bussing who prescribed dex '4mg'$  BID and increase fentanyl to 16mg every 3 days. Pt's daughter has been instructed for pt to wear only 1 patch every 3 days and can take up to 2 tablets oxycodone every 6-8 hours as needed for breakthrough pain. Pt's daughter educated that the dexamethasone will help with inflammation that may be causing pain as well. Instructed pt's daughter to take off all patches at this time and put on new patch for 1033m. Dr. BrRogue Bussingill not write prescription for methadone and pt's daughter was instructed to discuss this with the methadone clinic. Prescription for wheelchair given to patient. Informed pt's daughter that our office is working on getting wheelchair ramp from [employer] that their office will give her a call in a few days. Pt's daughter verbalized understanding.

## 2016-12-25 NOTE — Progress Notes (Signed)
Pt admitted to room. Skin assessment completed with Almyra Free RN, some scab/abrasions to both shins which are from scratching per pt. No other skin issues. Educated on call system plan of care for tonight, oriented to room and staff. Foley in place. PIV C/D/I. Pt in no distress at this time. Plan for OR tomorrow.

## 2016-12-25 NOTE — Consult Note (Signed)
NEW PATIENT EVALUATION  Name: [name]  MRN: [id number]  Date:   12/25/2016     DOB: [birth year]   This 71 y.o. male patient presents to the clinic for initial evaluation of stage IV lung cancer with metastasis to left hip.  REFERRING PHYSICIAN: Derinda Late, MD  CHIEF COMPLAINT:  Chief Complaint  Patient presents with  . Cancer    Pt is here for metastatic cancer.      DIAGNOSIS: The encounter diagnosis was Bone metastasis ([place]).   PREVIOUS INVESTIGATIONS:  PET CT scan reviewed Pending status post biopsy this week Clinical notes reviewed Clinical notes reviewed  HPI: Patient is a 71 year old male with multiple comorbidities including cirrhosis splenomegaly hepatitis C and chronic pain on methadone for over 10 years. He is a long-standing smoking history recently moved from [place] and was noted to have progressive right chest wall pain as well as left hip pain. He was seen by pulmonology had a PET CT scan performed showing intense hypermetabolic activity in the left upper lobe consistent with lung primary. There was also a large hypermetabolic not nodule in the left upper lobe favoring metastatic disease multiple lower pulmonary nodules concerning for pulmonary metastasis. He also had skeletal metastasis to the right vertebral body and pedicle a T2 as well as right anterior ribs. He also had a left femoral neck lesion which seems be causing his most significant pain. He is on about 200 g of fentanyl patch is still in pain which may be due to his previous history of methadone use. He is scheduled for a biopsy of his right rib lesion this week. I've asked to evaluate him for possible palliative radiation therapy to his left hip. He is wheelchair-bound not ambulating he states the pain is getting worse by the day.  PLANNED TREATMENT REGIMEN: Palliative radiation therapy to left hip  PAST MEDICAL HISTORY:  has a past medical history of Benign non-nodular prostatic  hyperplasia with lower urinary tract symptoms; Bladder cancer ([place]); Chronic hepatitis C without hepatic coma ([place]); Cirrhosis of liver ([place]); PUD (peptic ulcer disease); Rotator cuff tendinitis, right; Spondylosis of cervical region without myelopathy or radiculopathy; and Tobacco abuse.    PAST SURGICAL HISTORY:  Past Surgical History:  Procedure Laterality Date  . ABLATION LIVER TUMORS W/CRYOSURGICAL  2012  . CYSTOTOMY W/EXCISION BLADDER TUMOR  2012  . HERNIA REPAIR  1996  . REPAIR PERONEAL TENDONS ANKLE  1976    FAMILY HISTORY: family history includes Diabetes type II in his maternal grandmother; Lung cancer in his father and mother; Mental illness in his brother; Other in his brother.  SOCIAL HISTORY:  reports that he has quit smoking. His smoking use included Cigarettes. He has a 49.00 pack-year smoking history. He has never used smokeless tobacco. He reports that he does not drink alcohol or use drugs.  ALLERGIES: Iodine  MEDICATIONS:  Current Outpatient Prescriptions  Medication Sig Dispense Refill  . dexamethasone (DECADRON) 4 MG tablet Take 1 tablet (4 mg total) by mouth 2 (two) times daily. 20 tablet 0  . furosemide (LASIX) 80 MG tablet Take 1 tablet by mouth daily.    Marland Kitchen lactulose (CHRONULAC) 10 GM/15ML solution Take 15 mLs by mouth daily as needed for constipation.    . meloxicam (MOBIC) 15 MG tablet Take 1 tablet by mouth daily.    . methadone (DOLOPHINE) 10 MG/5ML solution Take 95 mg by mouth daily.     Marland Kitchen oxyCODONE (OXY IR/ROXICODONE) 5 MG immediate release tablet One pill every 6-8  hours 40 tablet 0  . spironolactone (ALDACTONE) 100 MG tablet Take 100 mg by mouth daily.     . tamsulosin (FLOMAX) 0.4 MG CAPS capsule Take 1 capsule by mouth daily.    . fentaNYL (DURAGESIC - DOSED MCG/HR) 100 MCG/HR Place 1 patch (100 mcg total) onto the skin every 3 (three) days. 5 patch 0   No current facility-administered medications for this encounter.     ECOG PERFORMANCE STATUS:  2  - Symptomatic, <50% confined to bed  REVIEW OF SYSTEMS:  Patient denies any weight loss, fatigue, weakness, fever, chills or night sweats. Patient denies any loss of vision, blurred vision. Patient denies any ringing  of the ears or hearing loss. No irregular heartbeat. Patient denies heart murmur or history of fainting. Patient denies any chest pain or pain radiating to her upper extremities. Patient denies any shortness of breath, difficulty breathing at night, cough or hemoptysis. Patient denies any swelling in the lower legs. Patient denies any nausea vomiting, vomiting of blood, or coffee ground material in the vomitus. Patient denies any stomach pain. Patient states has had normal bowel movements no significant constipation or diarrhea. Patient denies any dysuria, hematuria or significant nocturia. Patient denies any problems walking, swelling in the joints or loss of balance. Patient denies any skin changes, loss of hair or loss of weight. Patient denies any excessive worrying or anxiety or significant depression. Patient denies any problems with insomnia. Patient denies excessive thirst, polyuria, polydipsia. Patient denies any swollen glands, patient denies easy bruising or easy bleeding. Patient denies any recent infections, allergies or URI. Patient "s visual fields have not changed significantly in recent time.    PHYSICAL EXAM: BP 119/66   Pulse 72   Temp 98.2 F (36.8 C)   Resp 55  Well-developed male slightly cachectic wheelchair-bound. Range of motion of his left leg elicit significant pain. He has decreased motor strength in that lower extremity based on pain. He has no pain elicited on deep palpation of his right rib cage. Spinal manipulation does not elicit pain. Well-developed well-nourished patient in NAD. HEENT reveals PERLA, EOMI, discs not visualized.  Oral cavity is clear. No oral mucosal lesions are identified. Neck is clear without evidence of cervical or supraclavicular  adenopathy. Lungs are clear to A&P. Cardiac examination is essentially unremarkable with regular rate and rhythm without murmur rub or thrill. Abdomen is benign with no organomegaly or masses noted. Motor sensory and DTR levels are equal and symmetric in the upper and lower extremities. Cranial nerves II through XII are grossly intact. Proprioception is intact. No peripheral adenopathy or edema is identified. No motor or sensory levels are noted. Crude visual fields are within normal range.  LABORATORY DATA: Pathology reports to be reviewed when available    RADIOLOGY RESULTS: PET CT scan reviewed and compatible with the above-stated findings   IMPRESSION: Stage IV primary bronchogenic lung cancer in 71 year old male with extensive pain in his left hip.  PLAN: At this time send the patient for plain films to radiology to rule out left femoral neck fracture. I've also personally ordered and scheduled CT's simulation on his left hip. Would plan on delivering 3000 cGy in 10 fractions based on the weightbearing nature of this area. Risks and benefits of treatment including skin reaction fatigue alteration of blood counts all were described in detail to both the patient and his daughter. We'll also discussed with medical oncology that her pain control in this difficult patient to manage based on his  past medical history of methadone use. Other areas of palliation may include his right rib cage should that area become problematic in the future. We'll also review his pathology when available.  I would like to take this opportunity to thank you for allowing me to participate in the care of your patient.Armstead Peaks., MD

## 2016-12-25 NOTE — ED Provider Notes (Addendum)
[facility] Emergency Department Provider Note  ____________________________________________  Time seen: Approximately 5:07 PM  I have reviewed the triage vital signs and the nursing notes.   HISTORY  Chief Complaint Hip Pain   HPI [name] is a 71 y.o. 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 Patient came to see his oncologist today and was complaining of a week of left hip pain. He was sent to radiology where an x-ray was done confirming a left hip fracture. Patient was then sent to the emergency room. He reports no trauma and no fall. He has been able to walk with tremendous difficulty at home. Was complaining of severe pain in his left hip, worse with weightbearing or movement, constant, nonradiating. This time the pain is mild at rest.  Past Medical History:  Diagnosis Date  . Benign non-nodular prostatic hyperplasia with lower urinary tract symptoms   . Bladder cancer ([place])   . Chronic hepatitis C without hepatic coma ([place])   . Cirrhosis of liver ([place])   . PUD (peptic ulcer disease)   . Rotator cuff tendinitis, right   . Spondylosis of cervical region without myelopathy or radiculopathy   . Tobacco abuse     Patient Active Problem List   Diagnosis Date Noted  . Primary cancer of left upper lobe of lung ([place]) 12/22/2016  . Cancer, metastatic to bone ([place]) 12/22/2016  . Rotator cuff tendinitis, right 10/18/2015  . Spondylosis of cervical region without myelopathy or radiculopathy 10/18/2015  . PUD (peptic ulcer disease) 10/12/2015  . Tobacco abuse 06/28/2015  . Benign non-nodular prostatic hyperplasia with lower urinary tract symptoms 12/14/2014  . Bladder cancer ([place]) 12/14/2014  . Chronic hepatitis C without hepatic coma ([place]) 12/14/2014    Past Surgical History:    Procedure Laterality Date  . ABLATION LIVER TUMORS W/CRYOSURGICAL  2012  . CYSTOTOMY W/EXCISION BLADDER TUMOR  2012  . HERNIA REPAIR  1996  . REPAIR PERONEAL TENDONS ANKLE  1976    Prior to Admission medications   Medication Sig Start Date End Date Taking? Authorizing Provider  dexamethasone (DECADRON) 4 MG tablet Take 1 tablet (4 mg total) by mouth 2 (two) times daily. 12/25/16   Cammie Sickle, MD  fentaNYL (DURAGESIC - DOSED MCG/HR) 100 MCG/HR Place 1 patch (100 mcg total) onto the skin every 3 (three) days. 12/25/16   Cammie Sickle, MD  furosemide (LASIX) 80 MG tablet Take 1 tablet by mouth daily. 01/28/16   Historical Provider, MD  lactulose (CHRONULAC) 10 GM/15ML solution Take 15 mLs by mouth daily as needed for constipation.    Historical Provider, MD  meloxicam (MOBIC) 15 MG tablet Take 1 tablet by mouth daily. 12/12/16   Historical Provider, MD  methadone (DOLOPHINE) 10 MG/5ML solution Take 95 mg by mouth daily.     Historical Provider, MD  oxyCODONE (OXY IR/ROXICODONE) 5 MG immediate release tablet One pill every 6-8 hours 12/22/16   Cammie Sickle, MD  spironolactone (ALDACTONE) 100 MG tablet Take 100 mg by mouth daily.  09/21/15   Historical Provider, MD  tamsulosin (FLOMAX) 0.4 MG CAPS capsule Take 1 capsule by mouth daily. 01/28/16   Historical Provider, MD    Allergies Iodine  Family History  Problem Relation Age of Onset  . Lung cancer Father   . Diabetes type II Maternal Grandmother   .  Lung cancer Mother   . Mental illness Brother   . Other Brother     h/o substance abuse    Social History Social History  Substance Use Topics  . Smoking status: Former Smoker    Packs/day: 1.00    Years: 49.00    Types: Cigarettes  . Smokeless tobacco: Never Used  . Alcohol use No    Review of Systems  Constitutional: Negative for fever. Eyes: Negative for visual changes. ENT: Negative for sore throat. Neck: No neck pain  Cardiovascular: Negative for  chest pain. Respiratory: Negative for shortness of breath. Gastrointestinal: Negative for abdominal pain, vomiting or diarrhea. Genitourinary: Negative for dysuria. Musculoskeletal: Negative for back pain. + left hip pain Skin: Negative for rash. Neurological: Negative for headaches, weakness or numbness. Psych: No SI or HI  ____________________________________________   PHYSICAL EXAM:  VITAL SIGNS: ED Triage Vitals  Enc Vitals Group     BP 12/25/16 1638 130/69     Pulse Rate 12/25/16 1638 74     Resp 12/25/16 1638 17     Temp 12/25/16 1638 97.7 F (36.5 C)     Temp Source 12/25/16 1638 Oral     SpO2 12/25/16 1638 100 %     Weight 12/25/16 1638 175 lb (79.4 kg)     Height 12/25/16 1638 '5\' 9"'$  (1.753 m)     Head Circumference --      Peak Flow --      Pain Score 12/25/16 1637 3     Pain Loc --      Pain Edu? --      Excl. in [place]? --     Constitutional: Alert and oriented. Well appearing and in no apparent distress. HEENT:      Head: Normocephalic and atraumatic.         Eyes: Conjunctivae are normal. Sclera is non-icteric. EOMI. PERRL      Mouth/Throat: Mucous membranes are moist.       Neck: Supple with no signs of meningismus. Cardiovascular: Regular rate and rhythm. No murmurs, gallops, or rubs. 2+ symmetrical distal pulses are present in all extremities. No JVD. Respiratory: Normal respiratory effort. Lungs are clear to auscultation bilaterally. No wheezes, crackles, or rhonchi.  Gastrointestinal: Soft, non tender, and non distended with positive bowel sounds. No rebound or guarding. Musculoskeletal: shortened and rotated left LE, strong distal pulses Neurologic: Normal speech and language. Face is symmetric. Moving all extremities. No gross focal neurologic deficits are appreciated. Skin: Skin is warm, dry and intact. No rash noted. Psychiatric: Mood and affect are normal. Speech and behavior are normal.  ____________________________________________   LABS (all  labs ordered are listed, but only abnormal results are displayed)  Labs Reviewed  CBC  BASIC METABOLIC PANEL  PROTIME-INR  APTT  HEPATIC FUNCTION PANEL  TYPE AND SCREEN   ____________________________________________  EKG  ED ECG REPORT I, Rudene Re, the attending physician, personally viewed and interpreted this ECG.  Normal sinus rhythm, rate of 79, normal intervals, normal axis, no ST elevations or depressions, ____________________________________________  RADIOLOGY  XR hip:  Left hip fracture ____________________________________________   PROCEDURES  Procedure(s) performed: None Procedures Critical Care performed:  None ____________________________________________   INITIAL IMPRESSION / ASSESSMENT AND PLAN / ED COURSE  71 y.o. 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 Patient's pain is well controlled at this  time. Surgical labs ordered. Discussed with Dr. Rudene Christians who will plan surgery once cleared by Hospitalist. Foley ordered, maintenance fluids ordered. Fentanyl PRn ordered. Will admit to Hospitalist.     Pertinent labs & imaging results that were available during my care of the patient were reviewed by me and considered in my medical decision making (see chart for details).    ____________________________________________   FINAL CLINICAL IMPRESSION(S) / ED DIAGNOSES  Final diagnoses:  Closed fracture of left hip, initial encounter ([place])      NEW MEDICATIONS STARTED DURING THIS VISIT:  New Prescriptions   No medications on file     Note:  This document was prepared using Dragon voice recognition software and may include unintentional dictation errors.    Rudene Re, MD 12/25/16 [place], MD 12/25/16 [phone]

## 2016-12-25 NOTE — ED Triage Notes (Signed)
Patient presents to ED from out patient radiology. + left hip fracture. Patient is a cancer patient but has not started treatment yet. Patient noted to have 3 fentanyl patches on, all 80mg. Per ED Charge RN patient is only suppose to have 1 patch on that is 100 mcg. One patch removed by this RN and flushed. Patient c/o left hip pain 3/10. VSS.

## 2016-12-25 NOTE — Progress Notes (Signed)
Family Meeting Note  Advance Directive:yes  Today a meeting took place with the Patient.     The following clinical team members were present during this meeting:MD  The following were discussed:Patient's diagnosis: stage 4 metastaic lung cancer Left hip fracture , Patient's progosis: Unable to determine and Goals for treatment: DNR  Additional follow-up to be provided: would benefit from palliative care consult during this hospital stay.  Time spent during discussion:16 minutes  Safiyyah Vasconez, MD

## 2016-12-26 ENCOUNTER — Telehealth: Payer: Self-pay | Admitting: Internal Medicine

## 2016-12-26 ENCOUNTER — Inpatient Hospital Stay: Payer: Medicare Other

## 2016-12-26 ENCOUNTER — Inpatient Hospital Stay: Payer: Medicare Other | Admitting: Anesthesiology

## 2016-12-26 ENCOUNTER — Encounter: Payer: Self-pay | Admitting: *Deleted

## 2016-12-26 ENCOUNTER — Encounter: Payer: Self-pay | Admitting: Internal Medicine

## 2016-12-26 ENCOUNTER — Encounter
Admission: EM | Disposition: A | Discharge status: Home Health | Payer: Self-pay | Source: Home / Self Care | Attending: Internal Medicine

## 2016-12-26 HISTORY — PX: TOTAL HIP ARTHROPLASTY: SHX124

## 2016-12-26 LAB — CBC
HEMATOCRIT: 32.6 % — AB (ref 40.0–52.0)
HEMOGLOBIN: 11.1 g/dL — AB (ref 13.0–18.0)
MCH: 32.6 pg (ref 26.0–34.0)
MCHC: 34.1 g/dL (ref 32.0–36.0)
MCV: 95.6 fL (ref 80.0–100.0)
Platelets: 64 10*3/uL — ABNORMAL LOW (ref 150–440)
RBC: 3.41 MIL/uL — AB (ref 4.40–5.90)
RDW: 13.1 % (ref 11.5–14.5)
WBC: 3.1 10*3/uL — ABNORMAL LOW (ref 3.8–10.6)

## 2016-12-26 LAB — BASIC METABOLIC PANEL
Anion gap: 6 (ref 5–15)
BUN: 29 mg/dL — AB (ref 6–20)
CHLORIDE: 105 mmol/L (ref 101–111)
CO2: 24 mmol/L (ref 22–32)
Calcium: 8.2 mg/dL — ABNORMAL LOW (ref 8.9–10.3)
Creatinine, Ser: 1.15 mg/dL (ref 0.61–1.24)
GFR calc Af Amer: >60 mL/min (ref >60)
Glucose, Bld: 122 mg/dL — ABNORMAL HIGH (ref 65–99)
POTASSIUM: 4.8 mmol/L (ref 3.5–5.1)
SODIUM: 135 mmol/L (ref 135–145)

## 2016-12-26 SURGERY — ARTHROPLASTY, HIP, TOTAL, ANTERIOR APPROACH
Anesthesia: General | Laterality: Left | Wound class: Clean

## 2016-12-26 MED ORDER — MAGNESIUM HYDROXIDE 400 MG/5ML PO SUSP
30.0000 mL | Freq: Every day | ORAL | Status: DC | PRN
  Administered 2016-12-27 – 2016-12-28 (×2): 30 mL via ORAL
  Filled 2016-12-26 (×2): qty 30

## 2016-12-26 MED ORDER — ALUM & MAG HYDROXIDE-SIMETH 200-200-20 MG/5ML PO SUSP: 30.0000 mL | ORAL | Status: DC | PRN

## 2016-12-26 MED ORDER — HYDROMORPHONE HCL 1 MG/ML IJ SOLN
0.2500 mg | INTRAMUSCULAR | Status: DC | PRN
  Administered 2016-12-26 (×2): 0.5 mg via INTRAVENOUS

## 2016-12-26 MED ORDER — FENTANYL CITRATE (PF) 100 MCG/2ML IJ SOLN
INTRAMUSCULAR | Status: AC
  Filled 2016-12-26: qty 2

## 2016-12-26 MED ORDER — PHENOL 1.4 % MT LIQD
1.0000 | OROMUCOSAL | Status: DC | PRN
  Filled 2016-12-26: qty 177

## 2016-12-26 MED ORDER — PROMETHAZINE HCL 25 MG/ML IJ SOLN: 6.2500 mg | INTRAMUSCULAR | Status: DC | PRN

## 2016-12-26 MED ORDER — EPHEDRINE SULFATE 50 MG/ML IJ SOLN
INTRAMUSCULAR | Status: DC | PRN
  Administered 2016-12-26: 10 mg via INTRAVENOUS

## 2016-12-26 MED ORDER — SODIUM CHLORIDE 0.9 % IV SOLN
Freq: Once | INTRAVENOUS | Status: AC
  Administered 2016-12-26: 11:00:00 via INTRAVENOUS

## 2016-12-26 MED ORDER — OXYCODONE HCL 5 MG PO TABS
5.0000 mg | ORAL_TABLET | ORAL | Status: DC | PRN
  Administered 2016-12-26 (×2): 5 mg via ORAL
  Administered 2016-12-27 – 2016-12-28 (×3): 10 mg via ORAL
  Filled 2016-12-26: qty 2
  Filled 2016-12-26: qty 1
  Filled 2016-12-26 (×2): qty 2
  Filled 2016-12-26: qty 1

## 2016-12-26 MED ORDER — NEOMYCIN-POLYMYXIN B GU 40-200000 IR SOLN
Status: AC
  Filled 2016-12-26: qty 4

## 2016-12-26 MED ORDER — BUPIVACAINE-EPINEPHRINE (PF) 0.25% -1:200000 IJ SOLN
INTRAMUSCULAR | Status: AC
  Filled 2016-12-26: qty 30

## 2016-12-26 MED ORDER — SUGAMMADEX SODIUM 200 MG/2ML IV SOLN
INTRAVENOUS | Status: AC
  Filled 2016-12-26: qty 2

## 2016-12-26 MED ORDER — PROPOFOL 10 MG/ML IV BOLUS
INTRAVENOUS | Status: DC | PRN
  Administered 2016-12-26: 150 mg via INTRAVENOUS

## 2016-12-26 MED ORDER — METHOCARBAMOL 500 MG PO TABS: 500.0000 mg | ORAL_TABLET | Freq: Four times a day (QID) | ORAL | Status: DC | PRN

## 2016-12-26 MED ORDER — OXYCODONE HCL 5 MG/5ML PO SOLN: 5.0000 mg | Freq: Once | ORAL | Status: DC | PRN

## 2016-12-26 MED ORDER — BISACODYL 10 MG RE SUPP: 10.0000 mg | Freq: Every day | RECTAL | Status: DC | PRN

## 2016-12-26 MED ORDER — ROCURONIUM BROMIDE 50 MG/5ML IV SOLN
INTRAVENOUS | Status: AC
  Filled 2016-12-26: qty 1

## 2016-12-26 MED ORDER — ONDANSETRON HCL 4 MG/2ML IJ SOLN
INTRAMUSCULAR | Status: AC
  Filled 2016-12-26: qty 2

## 2016-12-26 MED ORDER — FENTANYL CITRATE (PF) 100 MCG/2ML IJ SOLN
25.0000 ug | INTRAMUSCULAR | Status: DC | PRN
Start: 2016-12-26 — End: 2016-12-26

## 2016-12-26 MED ORDER — CEFAZOLIN SODIUM 10 G IJ SOLR
2.0000 g | Freq: Four times a day (QID) | INTRAMUSCULAR | Status: AC
  Administered 2016-12-26 (×2): 2 g via INTRAVENOUS
  Filled 2016-12-26 (×2): qty 2000

## 2016-12-26 MED ORDER — HYDROMORPHONE HCL 1 MG/ML IJ SOLN
INTRAMUSCULAR | Status: AC
  Administered 2016-12-26: 0.5 mg via INTRAVENOUS
  Filled 2016-12-26: qty 1

## 2016-12-26 MED ORDER — MORPHINE SULFATE (PF) 2 MG/ML IV SOLN: 2.0000 mg | INTRAVENOUS | Status: DC | PRN

## 2016-12-26 MED ORDER — MEPERIDINE HCL 50 MG/ML IJ SOLN: 6.2500 mg | INTRAMUSCULAR | Status: DC | PRN

## 2016-12-26 MED ORDER — FAMOTIDINE 20 MG PO TABS: 20.0000 mg | ORAL_TABLET | Freq: Once | ORAL | Status: DC

## 2016-12-26 MED ORDER — SUGAMMADEX SODIUM 200 MG/2ML IV SOLN
INTRAVENOUS | Status: DC | PRN
  Administered 2016-12-26: 158.8 mg via INTRAVENOUS

## 2016-12-26 MED ORDER — ACETAMINOPHEN 10 MG/ML IV SOLN
INTRAVENOUS | Status: DC | PRN
  Administered 2016-12-26: 1000 mg via INTRAVENOUS

## 2016-12-26 MED ORDER — SODIUM CHLORIDE 0.9 % IV SOLN: INTRAVENOUS | Status: DC

## 2016-12-26 MED ORDER — SUCCINYLCHOLINE CHLORIDE 20 MG/ML IJ SOLN
INTRAMUSCULAR | Status: DC | PRN
  Administered 2016-12-26: 1 mg via INTRAVENOUS

## 2016-12-26 MED ORDER — DEXAMETHASONE SODIUM PHOSPHATE 10 MG/ML IJ SOLN
INTRAMUSCULAR | Status: DC | PRN
  Administered 2016-12-26: 10 mg via INTRAVENOUS

## 2016-12-26 MED ORDER — MAGNESIUM CITRATE PO SOLN
1.0000 | Freq: Once | ORAL | Status: DC | PRN
  Filled 2016-12-26: qty 296

## 2016-12-26 MED ORDER — DEXAMETHASONE SODIUM PHOSPHATE 10 MG/ML IJ SOLN
INTRAMUSCULAR | Status: AC
Start: 2016-12-26 — End: 2016-12-26
  Filled 2016-12-26: qty 1

## 2016-12-26 MED ORDER — HYDROMORPHONE HCL 1 MG/ML IJ SOLN
INTRAMUSCULAR | Status: AC
  Filled 2016-12-26: qty 1

## 2016-12-26 MED ORDER — NEOMYCIN-POLYMYXIN B GU 40-200000 IR SOLN
Status: DC | PRN
  Administered 2016-12-26: 4 mL

## 2016-12-26 MED ORDER — CEFAZOLIN SODIUM-DEXTROSE 2-4 GM/100ML-% IV SOLN: 2.0000 g | Freq: Four times a day (QID) | INTRAVENOUS | Status: DC

## 2016-12-26 MED ORDER — ROCURONIUM BROMIDE 100 MG/10ML IV SOLN
INTRAVENOUS | Status: DC | PRN
  Administered 2016-12-26: 40 mg via INTRAVENOUS
  Administered 2016-12-26: 10 mg via INTRAVENOUS

## 2016-12-26 MED ORDER — TRANEXAMIC ACID 1000 MG/10ML IV SOLN
INTRAVENOUS | Status: DC | PRN
  Administered 2016-12-26: 1000 mg via INTRAVENOUS

## 2016-12-26 MED ORDER — ACETAMINOPHEN 500 MG PO TABS
1000.0000 mg | ORAL_TABLET | Freq: Four times a day (QID) | ORAL | Status: AC
  Administered 2016-12-26 – 2016-12-27 (×4): 1000 mg via ORAL
  Filled 2016-12-26 (×4): qty 2

## 2016-12-26 MED ORDER — METHOCARBAMOL 1000 MG/10ML IJ SOLN
500.0000 mg | Freq: Four times a day (QID) | INTRAVENOUS | Status: DC | PRN
  Filled 2016-12-26: qty 5

## 2016-12-26 MED ORDER — ENOXAPARIN SODIUM 40 MG/0.4ML ~~LOC~~ SOLN
40.0000 mg | SUBCUTANEOUS | Status: DC
  Administered 2016-12-27 – 2016-12-28 (×2): 40 mg via SUBCUTANEOUS
  Filled 2016-12-26 (×2): qty 0.4

## 2016-12-26 MED ORDER — ACETAMINOPHEN 10 MG/ML IV SOLN
INTRAVENOUS | Status: AC
  Filled 2016-12-26: qty 100

## 2016-12-26 MED ORDER — DIPHENHYDRAMINE HCL 12.5 MG/5ML PO ELIX: 12.5000 mg | ORAL_SOLUTION | ORAL | Status: DC | PRN

## 2016-12-26 MED ORDER — BUPIVACAINE-EPINEPHRINE 0.25% -1:200000 IJ SOLN
INTRAMUSCULAR | Status: DC | PRN
  Administered 2016-12-26: 30 mL

## 2016-12-26 MED ORDER — ZOLPIDEM TARTRATE 5 MG PO TABS: 5.0000 mg | ORAL_TABLET | Freq: Every evening | ORAL | Status: DC | PRN

## 2016-12-26 MED ORDER — SUCCINYLCHOLINE CHLORIDE 20 MG/ML IJ SOLN
INTRAMUSCULAR | Status: AC
  Filled 2016-12-26: qty 1

## 2016-12-26 MED ORDER — HYDROMORPHONE HCL 1 MG/ML IJ SOLN
0.5000 mg | INTRAMUSCULAR | Status: AC | PRN
  Administered 2016-12-26 (×2): 0.5 mg via INTRAVENOUS

## 2016-12-26 MED ORDER — MENTHOL 3 MG MT LOZG
1.0000 | LOZENGE | OROMUCOSAL | Status: DC | PRN
  Filled 2016-12-26: qty 9

## 2016-12-26 MED ORDER — TRANEXAMIC ACID 1000 MG/10ML IV SOLN
INTRAVENOUS | Status: AC
  Filled 2016-12-26: qty 10

## 2016-12-26 MED ORDER — PROPOFOL 10 MG/ML IV BOLUS
INTRAVENOUS | Status: AC
  Filled 2016-12-26: qty 20

## 2016-12-26 MED ORDER — SODIUM CHLORIDE 0.9 % IV SOLN
INTRAVENOUS | Status: DC
  Administered 2016-12-26 (×2): via INTRAVENOUS

## 2016-12-26 MED ORDER — LIDOCAINE HCL (PF) 2 % IJ SOLN
INTRAMUSCULAR | Status: AC
  Filled 2016-12-26: qty 2

## 2016-12-26 MED ORDER — OXYCODONE HCL 5 MG PO TABS: 5.0000 mg | ORAL_TABLET | Freq: Once | ORAL | Status: DC | PRN

## 2016-12-26 SURGICAL SUPPLY — 44 items
BLADE SAW SAG 18.5X105 (BLADE) ×3 IMPLANT
BNDG COHESIVE 6X5 TAN STRL LF (GAUZE/BANDAGES/DRESSINGS) ×6 IMPLANT
CANISTER SUCT 1200ML W/VALVE (MISCELLANEOUS) ×3 IMPLANT
CAPT HIP HEMI 2 ×3 IMPLANT
CATH FOL LEG HOLDER (MISCELLANEOUS) ×3 IMPLANT
CATH TRAY METER 16FR LF (MISCELLANEOUS) ×3 IMPLANT
CHLORAPREP W/TINT 26ML (MISCELLANEOUS) ×3 IMPLANT
DRAPE C-ARM XRAY 36X54 (DRAPES) ×3 IMPLANT
DRAPE INCISE IOBAN 66X60 STRL (DRAPES) IMPLANT
DRAPE POUCH INSTRU U-SHP 10X18 (DRAPES) ×3 IMPLANT
DRAPE SHEET LG 3/4 BI-LAMINATE (DRAPES) ×9 IMPLANT
DRAPE TABLE BACK 80X90 (DRAPES) ×3 IMPLANT
DRSG OPSITE POSTOP 4X8 (GAUZE/BANDAGES/DRESSINGS) ×3 IMPLANT
ELECT BLADE 6.5 EXT (BLADE) ×3 IMPLANT
ELECT REM PT RETURN 9FT ADLT (ELECTROSURGICAL) ×3
ELECTRODE REM PT RTRN 9FT ADLT (ELECTROSURGICAL) ×1 IMPLANT
GLOVE BIOGEL PI IND STRL 9 (GLOVE) ×1 IMPLANT
GLOVE BIOGEL PI INDICATOR 9 (GLOVE) ×2
GLOVE SURG SYN 9.0  PF PI (GLOVE) ×4
GLOVE SURG SYN 9.0 PF PI (GLOVE) ×2 IMPLANT
GOWN SRG 2XL LVL 4 RGLN SLV (GOWNS) ×1 IMPLANT
GOWN STRL NON-REIN 2XL LVL4 (GOWNS) ×2
GOWN STRL REUS W/ TWL LRG LVL3 (GOWN DISPOSABLE) ×1 IMPLANT
GOWN STRL REUS W/TWL LRG LVL3 (GOWN DISPOSABLE) ×2
HEMOVAC 400CC 10FR (MISCELLANEOUS) IMPLANT
HOOD PEEL AWAY FLYTE STAYCOOL (MISCELLANEOUS) ×3 IMPLANT
MAT BLUE FLOOR 46X72 FLO (MISCELLANEOUS) ×3 IMPLANT
NDL SAFETY 18GX1.5 (NEEDLE) ×3 IMPLANT
NEEDLE SPNL 18GX3.5 QUINCKE PK (NEEDLE) ×3 IMPLANT
NS IRRIG 1000ML POUR BTL (IV SOLUTION) ×3 IMPLANT
PACK HIP COMPR (MISCELLANEOUS) ×3 IMPLANT
SOL PREP PVP 2OZ (MISCELLANEOUS) ×3
SOLUTION PREP PVP 2OZ (MISCELLANEOUS) ×1 IMPLANT
STAPLER SKIN PROX 35W (STAPLE) ×3 IMPLANT
STRAP SAFETY BODY (MISCELLANEOUS) ×3 IMPLANT
SUT DVC 2 QUILL PDO  T11 36X36 (SUTURE) ×2
SUT DVC 2 QUILL PDO T11 36X36 (SUTURE) ×1 IMPLANT
SUT SILK 0 (SUTURE) ×2
SUT SILK 0 30XBRD TIE 6 (SUTURE) ×1 IMPLANT
SUT V-LOC 90 ABS DVC 3-0 CL (SUTURE) ×3 IMPLANT
SUT VIC AB 1 CT1 36 (SUTURE) ×3 IMPLANT
SYR 20CC LL (SYRINGE) ×3 IMPLANT
SYR 30ML LL (SYRINGE) ×3 IMPLANT
TOWEL OR 17X26 4PK STRL BLUE (TOWEL DISPOSABLE) ×3 IMPLANT

## 2016-12-26 NOTE — Telephone Encounter (Signed)
Spoke to Dr.Patel re: pt's thrombocytopenia [68-70s] prior to hip surgery. 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 Recommend 1 unit of platelets just prior to surgery. I will see patient tomorrow.

## 2016-12-26 NOTE — Progress Notes (Signed)
[facility] [place] NAME: [name]    MR#:  [id number]  DATE OF BIRTH:  [birth year]  SUBJECTIVE:  Patient was complaining of left hip pain and radiation oncologist ordered x-rays which confirmed left hip fracture   Patient is postop left hip replacement  REVIEW OF SYSTEMS:   Review of Systems  Constitutional: Negative for chills, fever and weight loss.  HENT: Negative for ear discharge, ear pain and nosebleeds.   Eyes: Negative for blurred vision, pain and discharge.  Respiratory: Negative for sputum production, shortness of breath, wheezing and stridor.   Cardiovascular: Negative for chest pain, palpitations, orthopnea and PND.  Gastrointestinal: Negative for abdominal pain, diarrhea, nausea and vomiting.  Genitourinary: Negative for frequency and urgency.  Musculoskeletal: Positive for joint pain. Negative for back pain.  Neurological: Negative for sensory change, speech change, focal weakness and weakness.  Psychiatric/Behavioral: Negative for depression and hallucinations. The patient is not nervous/anxious.    Tolerating Diet: Tolerating PT:   DRUG ALLERGIES:   Allergies  Allergen Reactions  . Iodine Hives    VITALS:  Blood pressure 118/62, pulse 87, temperature 97.4 F (36.3 C), temperature source Oral, resp. rate 12, height '5\' 9"'$  (1.753 m), weight 79.4 kg (175 lb), SpO2 96 %.  PHYSICAL EXAMINATION:   Physical Exam  GENERAL:  71 y.o.-year-old patient lying in the bed with no acute distress.  EYES: Pupils equal, round, reactive to light and accommodation. No scleral icterus. Extraocular muscles intact.  HEENT: Head atraumatic, normocephalic. Oropharynx and nasopharynx clear.  NECK:  Supple, no jugular venous distention. No thyroid enlargement, no tenderness.  LUNGS: Normal breath sounds bilaterally, no wheezing, rales, rhonchi. No use of accessory muscles of respiration.  CARDIOVASCULAR: S1, S2 normal. No  murmurs, rubs, or gallops.  ABDOMEN: Soft, nontender, nondistended. Bowel sounds present. No organomegaly or mass.  EXTREMITIES: No cyanosis, clubbing or edema b/l.   Left hip dressing NEUROLOGIC: Cranial nerves II through XII are intact. No focal Motor or sensory deficits b/l.   PSYCHIATRIC:  patient is alert and oriented x 3.  SKIN: No obvious rash, lesion, or ulcer.   LABORATORY PANEL:  CBC  Recent Labs Lab 12/25/16 1648  WBC 5.2  HGB 12.9*  HCT 37.1*  PLT 75*    Chemistries   Recent Labs Lab 12/22/16 0948 12/25/16 1648 12/26/16 0446  NA 134* 133* 135  K 4.1 3.9 4.8  CL 102 99* 105  CO2 '24 25 24  '$ GLUCOSE 80 97 122*  BUN 42* 29* 29*  CREATININE 1.55* 1.35* 1.15  CALCIUM 8.7* 9.0 8.2*  MG 2.2  --   --   AST 83* 93*  --   ALT 47 55  --   ALKPHOS 84 92  --   BILITOT 1.6* 2.0*  --    Cardiac Enzymes No results for input(s): TROPONINI in the last 168 hours. RADIOLOGY:  Dg Chest Portable 1 View  Result Date: 12/25/2016 CLINICAL DATA:  Preop for left hip fracture. EXAM: PORTABLE CHEST 1 VIEW COMPARISON:  Head CT 12/18/2016 FINDINGS: 2 left lung mass and right third rib metastasis with pleural disease, known from recent PET-CT. No cardiomegaly. There is no edema, consolidation, effusion, or pneumothorax. No acute fracture IMPRESSION: 1. No acute finding. 2. Known left lung masses and right third rib metastasis. Electronically Signed   By: Monte Fantasia M.D.   On: 12/25/2016 17:16   Dg Hip Operative Unilat W Or W/o Pelvis Left  Result Date:  12/26/2016 CLINICAL DATA:  Left hip surgery EXAM: OPERATIVE LEFT HIP (WITH PELVIS IF PERFORMED) 1 VIEWS TECHNIQUE: Fluoroscopic spot image(s) were submitted for interpretation post-operatively. COMPARISON:  None. FINDINGS: Left hip arthroplasty without failure complication. No fracture or dislocation. IMPRESSION: Left hip arthroplasty. Electronically Signed   By: Kathreen Devoid   On: 12/26/2016 14:00   Dg Hip Unilat W Or W/o Pelvis 2-3  Views Left  Result Date: 12/26/2016 CLINICAL DATA:  Less post left hip joint replacement.  Days 0. EXAM: DG HIP (WITH OR WITHOUT PELVIS) 2-3V LEFT COMPARISON:  Preoperative radiograph of December 25, 2016 FINDINGS: The patient has undergone total left hip joint prosthesis placement. Radiographic positioning of the prosthetic components is good. The interface with the native bone appears normal. Surgical skin staples are noted laterally. IMPRESSION: There is no acute postprocedure complication following left hip joint prosthesis placement. Electronically Signed   By: David  Martinique M.D.   On: 12/26/2016 13:51   Dg Hip Unilat With Pelvis 2-3 Views Left  Result Date: 12/25/2016 CLINICAL DATA:  Fall 1 week ago with subsequent pain. Initial encounter. EXAM: DG HIP (WITH OR WITHOUT PELVIS) 2-3V LEFT COMPARISON:  PET-CT 12/18/2016 FINDINGS: Transcervical left femoral neck fracture, acute appearing, with 9 mm of displacement. This is pathologic by prior PET-CT. No dislocation. No visible pelvic ring fracture. IMPRESSION: Displaced left femoral neck fracture, pathologic based on PET-CT 12/18/2016 Electronically Signed   By: Monte Fantasia M.D.   On: 12/25/2016 17:19   ASSESSMENT AND PLAN:  71 year old male with recent diagnosis stage IV metastatic lung cancer who presented to Dr. Olena Leatherwood office for initial consultation and wascomplaining of left hip pain and now found to have left hip fracture.  1. Acute left hip fracture, nontraumatic: -Continue IV and oral pain meds. Resume his methadone. He needs daily stood softners in addition to pain managment Appreciate Dr. Rudene Christians help Pain control Start calcium supplementation  2. Mild hyponatremia in the setting of lung cancer and poor by mouth intake IV fluids   3. Chronic hepatitis C with liver cirrhosis/splenomegaly: -Patient has chronic from her cytopenia we transfused him 1 unit of platelet transfusion prior to surgery. This was recommended by Dr.  Jacinto Reap Continue lactulose, Lasix and Aldactone   4. BPH: Continue Flomax   5. History of IV drug use and chronic pain: Continue fentanyl and methadone   6. Stage IV lung cancer with metastatic disease:  Oncology evaluation appreciated.  7. DVT prophylaxis Lovenox per orthopedic will keep an eye on his platelet count  Case discussed with Care Management/Social Worker. Management plans discussed with the patient, family and they are in agreement.  CODE STATUS: DO NOT RESUSCITATE  DVT Prophylaxis: Lovenox  TOTAL TIME TAKING CARE OF THIS PATIENT: 30 minutes.  >50% time spent on counselling and coordination of care  POSSIBLE D/C IN 1-2 DAYS, DEPENDING ON CLINICAL CONDITION.  Note: This dictation was prepared with Dragon dictation along with smaller phrase technology. Any transcriptional errors that result from this process are unintentional.  Cherylanne Ardelean M.D on 12/26/2016 at 3:11 PM  Between 7am to 6pm - Pager - [phone]  After 6pm go to [email or web address] - password [email or web address]  Office  [phone]  CC: Primary care physician; BABAOFF, Caryl Bis, MD

## 2016-12-26 NOTE — Clinical Social Work Note (Signed)
Clinical Social Work Assessment  Patient Details  Name: [name] MRN: [id number] Date of Birth: 13-Jan-1946  Date of referral:  12/26/16               Reason for consult:  Facility Placement                Permission sought to share information with:  Chartered certified accountant granted to share information::  Yes, Verbal Permission Granted  Name::      [place]::   [place]   Relationship::     Contact Information:     Housing/Transportation Living arrangements for the past 2 months:  [place] of Information:  Patient Patient Interpreter Needed:  None Criminal Activity/Legal Involvement Pertinent to Current Situation/Hospitalization:  No - Comment as needed Significant Relationships:  Adult Children, Other Family Members Lives with:  Adult Children Do you feel safe going back to the place where you live?  Yes Need for family participation in patient care:  Yes (Comment)  Care giving concerns:  Patient lives with his daughter Threasa Beards, son in [place] and 2 grandchildren in [place].    Social Worker assessment / plan:  Holiday representative (CSW) received SNF consult. Per chart patient will have surgery for a hip fracture today. CSW met with patient alone at bedside prior to surgery. Patient was alert and oriented X4 and was sitting up in the bed. CSW introduced self and explained role of CSW department. Patient reported that he is starting radiation treatment for lung cancer and has not had his biopsy yet. Per patient he will go to [facility] center for treatment. Patient does not know what his radiation schedule will be. CSW explained that PT will evaluate patient after surgery and make a recommendation of home health or SNF. CSW explained SNF process and the medicare requires a 3 night qualifying inpatient stay at a hospital in order to pay for SNF. Patient was admitted to inpatient on 12/25/16. CSW explained that  medicare will pay for days 1-20 at 100% and days 21-100 at 80% at SNF. Patient is agreeable to SNF search in [facility] and does not have a preference at this time. FL2 complete and faxed out. CSW will continue to follow and assist as needed.   Employment status:  Retired Forensic scientist:  Medicare PT Recommendations:  Not assessed at this time [place] / Referral to community resources:  [place]  Patient/Family's Response to care:  Patient is agreeable to [facility] in [place].   Patient/Family's Understanding of and Emotional Response to Diagnosis, Current Treatment, and Prognosis:  Patient was pleasant and thanked CSW for assistance.   Emotional Assessment Appearance:  Appears stated age Attitude/Demeanor/Rapport:    Affect (typically observed):  Accepting, Adaptable, Pleasant Orientation:  Oriented to Self, Oriented to Place, Oriented to  Time, Oriented to Situation Alcohol / Substance use:  Not Applicable Psych involvement (Current and /or in the community):  No (Comment)  Discharge Needs  Concerns to be addressed:  Discharge Planning Concerns Readmission within the last 30 days:  No Current discharge risk:  Dependent with Mobility, Chronically ill Barriers to Discharge:  Continued Medical Work up   [employer], Veronia Beets, LCSW 12/26/2016, 2:32 PM

## 2016-12-26 NOTE — Care Management (Signed)
RNCM consult received for home health needs. PT pending. Will await recommendations. Following progression.

## 2016-12-26 NOTE — Anesthesia Post-op Follow-up Note (Cosign Needed)
Anesthesia QCDR form completed.        

## 2016-12-26 NOTE — NC FL2 (Signed)
[place] LEVEL OF CARE SCREENING TOOL     IDENTIFICATION  Patient Name: [name] Birthdate: 1946-06-12 Sex: male Admission Date (Current Location): 12/25/2016  [place] and [place] Number:  Engineering geologist and Address:  [facility], [address] [place], [place] [address]      Provider Number: [id number]  Attending Physician Name and Address:  Fritzi Mandes, MD  Relative Name and Phone Number:       Current Level of Care: Hospital Recommended Level of Care: [place] Prior Approval Number:    Date Approved/Denied:   PASRR Number:  [id number])  Discharge Plan: SNF    Current Diagnoses: Patient Active Problem List   Diagnosis Date Noted  . Hip fracture ([place]) 12/25/2016  . Primary cancer of left upper lobe of lung ([place]) 12/22/2016  . Cancer, metastatic to bone ([place]) 12/22/2016  . Rotator cuff tendinitis, right 10/18/2015  . Spondylosis of cervical region without myelopathy or radiculopathy 10/18/2015  . PUD (peptic ulcer disease) 10/12/2015  . Tobacco abuse 06/28/2015  . Benign non-nodular prostatic hyperplasia with lower urinary tract symptoms 12/14/2014  . Bladder cancer ([place]) 12/14/2014  . Chronic hepatitis C without hepatic coma ([place]) 12/14/2014    Orientation RESPIRATION BLADDER Height & Weight     Time, Situation, Self, Place  Normal Incontinent, Indwelling catheter Weight: 175 lb (79.4 kg) Height:  '5\' 9"'$  (175.3 cm)  BEHAVIORAL SYMPTOMS/MOOD NEUROLOGICAL BOWEL NUTRITION STATUS   (None. )  (None. ) Incontinent Diet (Diet: NPO due to surgery )  AMBULATORY STATUS COMMUNICATION OF NEEDS Skin   Extensive Assist Verbally Normal                       Personal Care Assistance Level of Assistance  Bathing, Feeding, Dressing Bathing Assistance: Limited assistance Feeding assistance: Independent Dressing Assistance: Limited assistance     Functional Limitations Info  Sight, Hearing, Speech  Sight Info: Adequate Hearing Info: Adequate Speech Info: Adequate    SPECIAL CARE FACTORS FREQUENCY  PT (By licensed PT), OT (By licensed OT)     PT Frequency:  (5) OT Frequency:  (5)            Contractures      Additional Factors Info  Code Status, Allergies Code Status Info:  (DNR) Allergies Info:  (Iodine)           Current Medications (12/26/2016):  This is the current hospital active medication list Current Facility-Administered Medications  Medication Dose Route Frequency Provider Last Rate Last Dose  . 0.9 %  sodium chloride infusion   Intravenous Continuous Bettey Costa, MD 100 mL/hr at 12/26/16 7253    . 0.9 %  sodium chloride infusion   Intravenous Once Fritzi Mandes, MD      . Derrill Memo ON 12/27/2016] 0.9 %  sodium chloride infusion   Intravenous Continuous Darrell K Allred, PA-C      . acetaminophen (TYLENOL) tablet 650 mg  650 mg Oral Q6H PRN Bettey Costa, MD       Or  . acetaminophen (TYLENOL) suppository 650 mg  650 mg Rectal Q6H PRN Bettey Costa, MD      . calcium carbonate (TUMS - dosed in mg elemental calcium) chewable tablet 400 mg of elemental calcium  2 tablet Oral BID WC Sital Mody, MD      . ceFAZolin (ANCEF) IVPB 1 g/50 mL premix  1 g Intravenous Once Hessie Knows, MD      . dexamethasone (  DECADRON) tablet 4 mg  4 mg Oral BID Bettey Costa, MD   4 mg at 12/25/16 2101  . docusate sodium (COLACE) capsule 100 mg  100 mg Oral BID Bettey Costa, MD   100 mg at 12/25/16 2101  . famotidine (PEPCID) tablet 20 mg  20 mg Oral Once Alvin Critchley, MD      . Derrill Memo ON 12/28/2016] fentaNYL (DURAGESIC - dosed mcg/hr) 100 mcg  100 mcg Transdermal Q72H Bettey Costa, MD      . fentaNYL (SUBLIMAZE) injection 25 mcg  25 mcg Intravenous Q2H PRN Rudene Re, MD      . furosemide (LASIX) tablet 80 mg  80 mg Oral Daily Sital Mody, MD      . HYDROmorphone (DILAUDID) injection 1 mg  1 mg Intravenous Q3H PRN Bettey Costa, MD   1 mg at 12/26/16 0830  . ketorolac (TORADOL) 15 MG/ML injection 15  mg  15 mg Intravenous Q6H PRN Bettey Costa, MD   15 mg at 12/26/16 0125  . lactulose (CHRONULAC) 10 GM/15ML solution 10 g  10 g Oral Daily Sital Mody, MD      . meloxicam (MOBIC) tablet 15 mg  15 mg Oral Daily Sital Mody, MD      . methadone (DOLOPHINE) 10 MG/ML solution 50 mg  50 mg Oral Daily Sital Mody, MD      . methadone (DOLOPHINE) 10 MG/ML solution 95 mg  95 mg Oral Daily Sital Mody, MD      . ondansetron (ZOFRAN) tablet 4 mg  4 mg Oral Q6H PRN Bettey Costa, MD       Or  . ondansetron (ZOFRAN) injection 4 mg  4 mg Intravenous Q6H PRN Bettey Costa, MD      . senna (SENOKOT) tablet 8.6 mg  1 tablet Oral BID Bettey Costa, MD   8.6 mg at 12/25/16 2101  . sodium phosphate (FLEET) 7-19 GM/118ML enema 1 enema  1 enema Rectal Once PRN Bettey Costa, MD      . spironolactone (ALDACTONE) tablet 100 mg  100 mg Oral Daily Sital Mody, MD      . tamsulosin (FLOMAX) capsule 0.4 mg  0.4 mg Oral Daily Bettey Costa, MD         Discharge Medications: Please see discharge summary for a list of discharge medications.  Relevant Imaging Results:  Relevant Lab Results:   Additional Information  (SSN: [id number])  Danie Chandler, Student-Social Work

## 2016-12-26 NOTE — Op Note (Signed)
12/25/2016 - 12/26/2016  1:25 PM  PATIENT:  [name]  71 y.o. male  PRE-OPERATIVE DIAGNOSIS:  left hip fracture, metastatic lung cancer  POST-OPERATIVE DIAGNOSIS:  left hip fracture  PROCEDURE:  Procedure(s): HIP HEMIARTHROPLASTY POSSIBLE TOTAL HIP (Left)  Left hip hemiarthroplasty final procedure SURGEON: Laurene Footman, MD  ASSISTANTS: None  ANESTHESIA:   general  EBL:  Total I/O In: 1060 [I.V.:900; Blood:160] Out: 1000 [Urine:700; Blood:300]  BLOOD ADMINISTERED:none  DRAINS: none   LOCAL MEDICATIONS USED:  MARCAINE    and OTHER txa injected into the joint  SPECIMEN:  Source of Specimen:  Femoral head and neck  DISPOSITION OF SPECIMEN:  PATHOLOGY  COUNTS:  YES  TOURNIQUET:  * No tourniquets in log *  IMPLANTS: Medacta AMIS 5 lateralized with S 28 head and 54 bipolar head  DICTATION: .Dragon Dictation   The patient was brought to the operating room and after gereral anesthesia was obtained patient was placed on the operative table with the ipsilateral foot into the Medacta attachment, contralateral leg on a well-padded table. C-arm was brought in and preop template x-ray taken. After prepping and draping in usual sterile fashion appropriate patient identification and timeout procedures were completed. Anterior approach to the hip was obtained and centered over the greater trochanter and TFL muscle. The subcutaneous tissue was incised hemostasis being achieved by electrocautery. TFL fascia was incised and the muscle retracted laterally deep retractor placed. The lateral femoral circumflex vessels were identified and ligated. The anterior capsule was exposed and a capsulotomy performed. The neck was identified and a femoral neck cut carried out with a saw. The head was removed and there was some discoloration consistent with tumor to the medial aspect of the base of the neck femoral neck cut was made with the more proximal portion sent as and some marrow of the first box  osteotome all sent as specimen. Femoral head template showed a 54 mm and a 54 mm trial fit well The leg was then externally rotated and ischiofemoral and pubofemoral releases carried out. The femur was sequentially broached to a size 5, size 5 stem and lateralized neck trials were placed and the final components chosen. The 5 lateralized stem was inserted along with a S 28 mm head and 54 mm I Polar Care liner. The hip was reduced and was stable the wound was thoroughly irrigated with a dilute Betadine solution. The deep fascia was closed using a heavy Quill after infiltration of 30 cc of quarter percent Sensorcaine with epinephrine. TXA was then injected into the joint,v-loc used to do a subcuticular closure followed by  skin staples Xeroform And honeycomb dressing  PLAN OF CARE: Continues as inpatient

## 2016-12-26 NOTE — Transfer of Care (Signed)
Immediate Anesthesia Transfer of Care Note  Patient: [name]  Procedure(s) Performed: Procedure(s): HIP HEMIARTHROPLASTY POSSIBLE TOTAL HIP (Left)  Patient Location: PACU  Anesthesia Type:General  Level of Consciousness: sedated  Airway & Oxygen Therapy: Patient Spontanous Breathing and Patient connected to face mask oxygen  Post-op Assessment: Report given to RN  Post vital signs: Reviewed and stable  Last Vitals:  Vitals:   12/26/16 1100 12/26/16 1114  BP: 132/60 127/63  Pulse: 67 76  Resp: 18 18  Temp: 36.7 C 36.6 C    Last Pain:  Vitals:   12/26/16 1114  TempSrc: Tympanic  PainSc: 5          Complications: No apparent anesthesia complications

## 2016-12-26 NOTE — Anesthesia Postprocedure Evaluation (Signed)
Anesthesia Post Note  Patient: [name]  Procedure(s) Performed: Procedure(s) (LRB): HIP HEMIARTHROPLASTY POSSIBLE TOTAL HIP (Left)  Patient location during evaluation: PACU Anesthesia Type: General Level of consciousness: awake and alert and oriented Pain management: pain level controlled Vital Signs Assessment: post-procedure vital signs reviewed and stable Respiratory status: spontaneous breathing, nonlabored ventilation and respiratory function stable Cardiovascular status: blood pressure returned to baseline and stable Postop Assessment: no signs of nausea or vomiting Anesthetic complications: no     Last Vitals:  Vitals:   12/26/16 1358 12/26/16 1404  BP: 125/66   Pulse: 83 84  Resp: 11 10  Temp:  36.4 C    Last Pain:  Vitals:   12/26/16 1358  TempSrc:   PainSc: 5                  Jaquavius Hudler

## 2016-12-26 NOTE — Evaluation (Signed)
Physical Therapy Evaluation Patient Details Name: [name] MRN: [id number] DOB: 1945-12-06 Today's Date: 12/26/2016   History of Present Illness  Pt is admitted for L hip fracture secondary to patholgic fracture. Pt is now s/p L hip hemiarthroplasty. 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  Clinical Impression  Pt is a pleasant 71 year old male who was admitted for L hip fracture secondary to pathologic fracture. Pt is now s/p L hip hemiarthroplasty. Pt performs bed mobility with cga and transfers with min assist using RW. Pt is unable to perform ambulation this date. Pt very anxious about mobility, however does better than he perceives. Pt needs heavy encouragement to participate. Pt demonstrates deficits with strength/mobility/pain/endurance. Would benefit from skilled PT to address above deficits and promote optimal return to PLOF. Recommend transition to [place] upon discharge from acute hospitalization.       Follow Up Recommendations Home health PT    Equipment Recommendations       Recommendations for Other Services       Precautions / Restrictions Precautions Precautions: Fall;Anterior Hip Precaution Booklet Issued: No Restrictions Weight Bearing Restrictions: Yes LLE Weight Bearing: Weight bearing as tolerated      Mobility  Bed Mobility Overal bed mobility: Needs Assistance Bed Mobility: Supine to Sit     Supine to sit: Min guard     General bed mobility comments: Pt is very hesitant to perform mobility and reports high level of fear and anxiety. Pt able to transition to EOB with assist for sliding B LEs off.   Transfers Overall transfer level: Needs assistance Equipment used: Rolling walker (2 wheeled) Transfers: Sit to/from Stand Sit to Stand: Min assist         General transfer comment: Able to stand at bedside, although very fearful and needs cues for WBing on surgical leg. Pt only  able to tolerate standing for grossly 1-2 mintues, returns back to seated position.  Ambulation/Gait             General Gait Details: unable to perform this date  Stairs            Wheelchair Mobility    Modified Rankin (Stroke Patients Only)       Balance Overall balance assessment: Needs assistance Sitting-balance support: Feet supported Sitting balance-Leahy Scale: Normal     Standing balance support: Bilateral upper extremity supported Standing balance-Leahy Scale: Good                               Pertinent Vitals/Pain Pain Assessment: 0-10 Pain Score: 4  Pain Location: L hip Pain Descriptors / Indicators: Operative site guarding Pain Intervention(s): Limited activity within patient's tolerance;Ice applied;Repositioned    Home Living Family/patient expects to be discharged to:: Private residence Living Arrangements: Children (daughter, son in Sports coach, grandkids) Available Help at Discharge: Family Type of Home: House Home Access: Stairs to enter Entrance Stairs-Rails: Left Entrance Stairs-Number of Steps: 1 step, followed by 3 steps Home Layout: One level Home Equipment: Walker - 2 wheels (in process of getting WC)      Prior Function Level of Independence: Independent               Hand Dominance        Extremity/Trunk Assessment   Upper Extremity Assessment Upper Extremity Assessment: Overall WFL for tasks assessed    Lower Extremity Assessment Lower Extremity  Assessment: Generalized weakness (L LE grossly 3/5; R LE grossly 4/5)       Communication   Communication: No difficulties  Cognition Arousal/Alertness: Awake/alert Behavior During Therapy: WFL for tasks assessed/performed Overall Cognitive Status: Within Functional Limits for tasks assessed                                        General Comments      Exercises Other Exercises Other Exercises: supine ther-ex performed on L LE including  ankle pumps, quad sets, hip ab/add, and SAQ. All ther-ex performed x 10 reps with min assist and safe technique   Assessment/Plan    PT Assessment Patient needs continued PT services  PT Problem List Decreased strength;Decreased activity tolerance;Decreased balance;Decreased mobility;Pain;Decreased knowledge of use of DME       PT Treatment Interventions DME instruction;Gait training;Therapeutic exercise;Stair training    PT Goals (Current goals can be found in the Care Plan section)  Acute Rehab PT Goals Patient Stated Goal: to get stronger PT Goal Formulation: With patient Time For Goal Achievement: 01/09/17 Potential to Achieve Goals: Good    Frequency BID   Barriers to discharge        Co-evaluation               End of Session Equipment Utilized During Treatment: Gait belt Activity Tolerance: Patient limited by pain;Patient limited by fatigue Patient left: in bed;with bed alarm set;with SCD's reapplied Nurse Communication: Mobility status PT Visit Diagnosis: Unsteadiness on feet (R26.81);Muscle weakness (generalized) (M62.81);Pain Pain - Right/Left: Left Pain - part of body: Knee    Time: 9563-8756 PT Time Calculation (min) (ACUTE ONLY): 20 min   Charges:   PT Evaluation $PT Eval Low Complexity: 1 Procedure PT Treatments $Therapeutic Exercise: 8-22 mins   PT G Codes:        [name], PT, DPT [phone]   Iviana Blasingame 12/26/2016, 5:12 PM

## 2016-12-26 NOTE — Anesthesia Preprocedure Evaluation (Signed)
Anesthesia Evaluation  Patient identified by MRN, date of birth, ID band Patient awake    Reviewed: Allergy & Precautions, NPO status , Patient's Chart, lab work & pertinent test results  History of Anesthesia Complications Negative for: history of anesthetic complications  Airway Mallampati: II  TM Distance: >3 FB Neck ROM: Full    Dental  (+) Partial Lower, Implants   Pulmonary neg sleep apnea, neg COPD, former smoker,    breath sounds clear to auscultation- rhonchi (-) wheezing      Cardiovascular (-) hypertension(-) CAD and (-) Past MI  Rhythm:Regular Rate:Normal - Systolic murmurs and - Diastolic murmurs    Neuro/Psych negative neurological ROS  negative psych ROS   GI/Hepatic PUD, (+) Cirrhosis       , Hepatitis -, C  Endo/Other  negative endocrine ROSneg diabetes  Renal/GU negative Renal ROS     Musculoskeletal  (+) Arthritis ,   Abdominal (+) - obese,   Peds  Hematology thrombocytopenia   Anesthesia Other Findings Chronic methadone therapy  Reproductive/Obstetrics                             Anesthesia Physical Anesthesia Plan  ASA: III  Anesthesia Plan: General   Post-op Pain Management:    Induction: Intravenous  Airway Management Planned: Oral ETT  Additional Equipment:   Intra-op Plan:   Post-operative Plan: Extubation in OR  Informed Consent: I have reviewed the patients History and Physical, chart, labs and discussed the procedure including the risks, benefits and alternatives for the proposed anesthesia with the patient or authorized representative who has indicated his/her understanding and acceptance.   Dental advisory given  Plan Discussed with: CRNA and Anesthesiologist  Anesthesia Plan Comments:         Lab Results  Component Value Date   WBC 5.2 12/25/2016   HGB 12.9 (L) 12/25/2016   HCT 37.1 (L) 12/25/2016   MCV 95.5 12/25/2016   PLT 75  (L) 12/25/2016    Anesthesia Quick Evaluation

## 2016-12-26 NOTE — Clinical Social Work Placement (Signed)
   CLINICAL SOCIAL WORK PLACEMENT  NOTE  Date:  12/26/2016  Patient Details  Name: Bayron Dalto MRN: [id number] Date of Birth: 09/03/1946  Clinical Social Work is seeking post-discharge placement for this patient at the [place] level of care (*CSW will initial, date and re-position this form in  chart as items are completed):  Yes   Patient/family provided with [facility] Work Department's list of facilities offering this level of care within the geographic area requested by the patient (or if unable, by the patient's family).  Yes   Patient/family informed of their freedom to choose among providers that offer the needed level of care, that participate in Medicare, Medicaid or managed care program needed by the patient, have an available bed and are willing to accept the patient.  Yes   Patient/family informed of [facility]'s ownership interest in [facility] and [facility], as well as of the fact that they are under no obligation to receive care at these facilities.  PASRR submitted to EDS on 12/26/16     PASRR number received on 12/26/16     Existing PASRR number confirmed on       FL2 transmitted to all facilities in geographic area requested by pt/family on 12/26/16     FL2 transmitted to all facilities within larger geographic area on       Patient informed that his/her managed care company has contracts with or will negotiate with certain facilities, including the following:            Patient/family informed of bed offers received.  Patient chooses bed at       Physician recommends and patient chooses bed at      Patient to be transferred to   on  .  Patient to be transferred to facility by       Patient family notified on   of transfer.  Name of family member notified:        PHYSICIAN       Additional Comment:    _______________________________________________ Sharryn Belding, Veronia Beets, LCSW 12/26/2016, 2:31 PM

## 2016-12-26 NOTE — Anesthesia Procedure Notes (Signed)
Procedure Name: Intubation Date/Time: 12/26/2016 12:31 PM Performed by: Nelda Marseille Pre-anesthesia Checklist: Patient identified, Patient being monitored, Timeout performed, Emergency Drugs available and Suction available Patient Re-evaluated:Patient Re-evaluated prior to inductionOxygen Delivery Method: Circle system utilized Preoxygenation: Pre-oxygenation with 100% oxygen Intubation Type: IV induction Ventilation: Mask ventilation without difficulty Laryngoscope Size: Mac and 4 Grade View: Grade I Tube type: Oral Tube size: 7.5 mm Number of attempts: 1 Airway Equipment and Method: Stylet Placement Confirmation: ETT inserted through vocal cords under direct vision,  positive ETCO2 and breath sounds checked- equal and bilateral Secured at: 21 cm Tube secured with: Tape Dental Injury: Teeth and Oropharynx as per pre-operative assessment

## 2016-12-26 NOTE — Telephone Encounter (Signed)
I spoke to Dr.Rubinas, in path re: plan for hip arthroplasty; and this could be used to make diagnosis; and also for molecular studies.

## 2016-12-27 ENCOUNTER — Ambulatory Visit (HOSPITAL_COMMUNITY): Admission: RE | Admit: 2016-12-27 | Payer: Medicare Other | Source: Ambulatory Visit

## 2016-12-27 ENCOUNTER — Other Ambulatory Visit: Payer: Self-pay | Admitting: Pathology

## 2016-12-27 ENCOUNTER — Ambulatory Visit (HOSPITAL_COMMUNITY): Payer: Medicare Other

## 2016-12-27 ENCOUNTER — Ambulatory Visit: Payer: Medicare Other

## 2016-12-27 DIAGNOSIS — R161 Splenomegaly, not elsewhere classified: Secondary | ICD-10-CM

## 2016-12-27 DIAGNOSIS — Z87891 Personal history of nicotine dependence: Secondary | ICD-10-CM

## 2016-12-27 DIAGNOSIS — R918 Other nonspecific abnormal finding of lung field: Secondary | ICD-10-CM

## 2016-12-27 DIAGNOSIS — C801 Malignant (primary) neoplasm, unspecified: Secondary | ICD-10-CM

## 2016-12-27 DIAGNOSIS — B182 Chronic viral hepatitis C: Secondary | ICD-10-CM

## 2016-12-27 DIAGNOSIS — Z66 Do not resuscitate: Secondary | ICD-10-CM

## 2016-12-27 DIAGNOSIS — G893 Neoplasm related pain (acute) (chronic): Secondary | ICD-10-CM

## 2016-12-27 DIAGNOSIS — N401 Enlarged prostate with lower urinary tract symptoms: Secondary | ICD-10-CM

## 2016-12-27 DIAGNOSIS — C7951 Secondary malignant neoplasm of bone: Secondary | ICD-10-CM

## 2016-12-27 DIAGNOSIS — Z8551 Personal history of malignant neoplasm of bladder: Secondary | ICD-10-CM

## 2016-12-27 DIAGNOSIS — Z79899 Other long term (current) drug therapy: Secondary | ICD-10-CM

## 2016-12-27 DIAGNOSIS — K7460 Unspecified cirrhosis of liver: Secondary | ICD-10-CM

## 2016-12-27 LAB — BPAM PLATELET PHERESIS
Blood Product Expiration Date: 201804192359
ISSUE DATE / TIME: 201804171032
UNIT TYPE AND RH: 5100

## 2016-12-27 LAB — CBC
HEMATOCRIT: 28.7 % — AB (ref 40.0–52.0)
Hemoglobin: 9.9 g/dL — ABNORMAL LOW (ref 13.0–18.0)
MCH: 32.9 pg (ref 26.0–34.0)
MCHC: 34.5 g/dL (ref 32.0–36.0)
MCV: 95.5 fL (ref 80.0–100.0)
PLATELETS: 75 10*3/uL — AB (ref 150–440)
RBC: 3.01 MIL/uL — ABNORMAL LOW (ref 4.40–5.90)
RDW: 12.7 % (ref 11.5–14.5)
WBC: 5.6 10*3/uL (ref 3.8–10.6)

## 2016-12-27 LAB — PREPARE PLATELET PHERESIS: UNIT DIVISION: 0

## 2016-12-27 LAB — BASIC METABOLIC PANEL
ANION GAP: 4 — AB (ref 5–15)
BUN: 37 mg/dL — ABNORMAL HIGH (ref 6–20)
CHLORIDE: 106 mmol/L (ref 101–111)
CO2: 23 mmol/L (ref 22–32)
CREATININE: 1.26 mg/dL — AB (ref 0.61–1.24)
Calcium: 7.8 mg/dL — ABNORMAL LOW (ref 8.9–10.3)
GFR calc non Af Amer: 57 mL/min — ABNORMAL LOW (ref >60)
Glucose, Bld: 168 mg/dL — ABNORMAL HIGH (ref 65–99)
Potassium: 4.5 mmol/L (ref 3.5–5.1)
Sodium: 133 mmol/L — ABNORMAL LOW (ref 135–145)

## 2016-12-27 MED ORDER — FAMOTIDINE 20 MG PO TABS
10.0000 mg | ORAL_TABLET | Freq: Four times a day (QID) | ORAL | Status: DC | PRN
  Filled 2016-12-27: qty 1

## 2016-12-27 MED ORDER — CALCIUM CARBONATE-VITAMIN D 500-200 MG-UNIT PO TABS
1.0000 | ORAL_TABLET | Freq: Two times a day (BID) | ORAL | Status: DC
  Administered 2016-12-27 – 2016-12-28 (×3): 1 via ORAL
  Filled 2016-12-27 (×3): qty 1

## 2016-12-27 MED ORDER — FAMOTIDINE 20 MG PO TABS
20.0000 mg | ORAL_TABLET | Freq: Two times a day (BID) | ORAL | Status: DC | PRN
Start: 2016-12-27 — End: 2016-12-28
  Administered 2016-12-27: 20 mg via ORAL

## 2016-12-27 MED ORDER — FENTANYL 50 MCG/HR TD PT72
100.0000 ug | MEDICATED_PATCH | TRANSDERMAL | Status: DC
  Administered 2016-12-27: 100 ug via TRANSDERMAL
  Filled 2016-12-27: qty 2

## 2016-12-27 NOTE — Progress Notes (Signed)
PT is recommending home health. Patient is agreeable to going home with his daughter with home health. RN case manager aware of above. Please reconsult if future social work needs arise. CSW signing off.   [employer], LCSW [phone]

## 2016-12-27 NOTE — Progress Notes (Signed)
Physical Therapy Treatment Patient Details Name: [name] MRN: [id number] DOB: 1946/07/14 Today's Date: 12/27/2016    History of Present Illness Pt is admitted for L hip fracture secondary to patholgic fracture. Pt is now s/p L hip hemiarthroplasty. 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    PT Comments    Pt is making good progress towards goals with improved ambulation using RW. Pt able to demonstrate fluid gait pattern with cues and reports pain is less than it was prior to Sx. Good endurance with there-ex, pt appears motivated to work with therapy. Will continue to progress.   Follow Up Recommendations  Home health PT     Equipment Recommendations  None recommended by PT    Recommendations for Other Services       Precautions / Restrictions Precautions Precautions: Fall;Anterior Hip Precaution Booklet Issued: No Restrictions Weight Bearing Restrictions: Yes LLE Weight Bearing: Weight bearing as tolerated    Mobility  Bed Mobility Overal bed mobility: Needs Assistance Bed Mobility: Supine to Sit     Supine to sit: Min guard     General bed mobility comments: Improved ease this date, still requires assist to guide B LE off bed. Pt with strong upper body and can sit at EOB.   Transfers Overall transfer level: Needs assistance Equipment used: Rolling walker (2 wheeled) Transfers: Sit to/from Stand Sit to Stand: Min guard         General transfer comment: Improved technique for standing at bedside with RW. Able to follow commands and stand with upright posture. Good WBing through L LE  Ambulation/Gait Ambulation/Gait assistance: Min guard Ambulation Distance (Feet): 100 Feet Assistive device: Rolling walker (2 wheeled) Gait Pattern/deviations: Step-through pattern     General Gait Details: Pt ambulated in hallway with reciprocal gait pattern, however slow speed. Pt cued to look forward  instead of at ground. Heavy UE WB on RW, however improved with verbal cues. SLightly fatigued with increased distance.   Stairs            Wheelchair Mobility    Modified Rankin (Stroke Patients Only)       Balance                                            Cognition Arousal/Alertness: Awake/alert Behavior During Therapy: WFL for tasks assessed/performed Overall Cognitive Status: Within Functional Limits for tasks assessed                                        Exercises Other Exercises Other Exercises: supine ther-ex performed on L LE including ankle pumps, quad sets, hip ab/add, and SAQ. All ther-ex performed x 12 reps with min assist and safe technique    General Comments        Pertinent Vitals/Pain Pain Assessment: 0-10 Pain Score: 2  Pain Location: L hip Pain Descriptors / Indicators: Operative site guarding Pain Intervention(s): Limited activity within patient's tolerance;Ice applied    Home Living                      Prior Function            PT Goals (current goals can now be found in the  care plan section) Acute Rehab PT Goals Patient Stated Goal: to get stronger PT Goal Formulation: With patient Time For Goal Achievement: 01/09/17 Potential to Achieve Goals: Good Progress towards PT goals: Progressing toward goals    Frequency    BID      PT Plan Current plan remains appropriate    Co-evaluation             End of Session Equipment Utilized During Treatment: Gait belt Activity Tolerance: Patient tolerated treatment well Patient left: in chair;with chair alarm set Nurse Communication: Mobility status PT Visit Diagnosis: Unsteadiness on feet (R26.81);Muscle weakness (generalized) (M62.81);Pain Pain - Right/Left: Left Pain - part of body: Hip     Time: 6701-4103 PT Time Calculation (min) (ACUTE ONLY): 25 min  Charges:  $Gait Training: 8-22 mins $Therapeutic Exercise: 8-22  mins                    G Codes:       Greggory Stallion, PT, DPT [phone]    Indi Willhite 12/27/2016, 12:09 PM

## 2016-12-27 NOTE — Progress Notes (Signed)
   Subjective: 1 Day Post-Op Procedure(s) (LRB): HIP HEMIARTHROPLASTY POSSIBLE TOTAL HIP (Left) Patient reports pain as 1 on 0-10 scale.   Patient is well, and has had no acute complaints or problems Denies any CP, SOB, ABD pain. We will continue therapy today.    Objective: Vital signs in last 24 hours: Temp:  [97.3 F (36.3 C)-98.4 F (36.9 C)] 98 F (36.7 C) (04/18 0755) Pulse Rate:  [64-95] 64 (04/18 0755) Resp:  [10-18] 18 (04/18 0755) BP: (99-134)/(50-66) 109/50 (04/18 0755) SpO2:  [94 %-100 %] 97 % (04/18 0755)  Intake/Output from previous day: 04/17 0701 - 04/18 0700 In: 3630 [P.O.:530; I.V.:2840; Blood:160; IV Piggyback:100] Out: 1915 [Urine:1615; Blood:300] Intake/Output this shift: No intake/output data recorded.   Recent Labs  12/25/16 1648 12/26/16 0446 12/27/16 0548  HGB 12.9* 11.1* 9.9*    Recent Labs  12/26/16 0446 12/27/16 0548  WBC 3.1* 5.6  RBC 3.41* 3.01*  HCT 32.6* 28.7*  PLT 64* 75*    Recent Labs  12/26/16 0446 12/27/16 0548  NA 135 133*  K 4.8 4.5  CL 105 106  CO2 24 23  BUN 29* 37*  CREATININE 1.15 1.26*  GLUCOSE 122* 168*  CALCIUM 8.2* 7.8*    Recent Labs  12/25/16 1648  INR 1.12    EXAM General - Patient is Alert, Appropriate and Oriented Extremity - Neurovascular intact Sensation intact distally Intact pulses distally Dorsiflexion/Plantar flexion intact No cellulitis present Compartment soft Dressing - dressing C/D/I and moderate drainage Motor Function - intact, moving foot and toes well on exam.   Past Medical History:  Diagnosis Date  . Benign non-nodular prostatic hyperplasia with lower urinary tract symptoms   . Bladder cancer ([place])   . Chronic hepatitis C without hepatic coma ([place])   . Cirrhosis of liver ([place])   . PUD (peptic ulcer disease)   . Rotator cuff tendinitis, right   . Spondylosis of cervical region without myelopathy or radiculopathy   . Tobacco abuse     Assessment/Plan:   1 Day  Post-Op Procedure(s) (LRB): HIP HEMIARTHROPLASTY POSSIBLE TOTAL HIP (Left) Active Problems:   Hip fracture (HCC)   Acute post op blood loss anemia with underlying chronic anemia  Estimated body mass index is 25.84 kg/m as calculated from the following:   Height as of this encounter: '5\' 9"'$  (1.753 m).   Weight as of this encounter: 79.4 kg (175 lb). Advance diet Up with therapy  Needs BM Acute post op blood loss anemia with underlying chronic anemia - Recheck labs in the am Dressing change today.  DVT Prophylaxis - Lovenox, Foot Pumps and TED hose Weight-Bearing as tolerated to left leg   T. Rachelle Hora, PA-C [place] 12/27/2016, 8:19 AM

## 2016-12-27 NOTE — Care Management Note (Signed)
Case Management Note  Patient Details  Name: [name] MRN: [id number] Date of Birth: 09-May-1946  Subjective/Objective: POD # 1 left THA. Spoke with patient and he ask that I call his daughter since he will be staying with her. Her name is [name] [phone]). TC to [facility], discussed discharge planning. Offered choice of home health agencies. Referral to [facility] for PT.He has a walker and a wheelchair. Daughter to buy him a shower bench. Pharmacy: [facility] [phone]). Called Lovenox 40 mg # 14 no refills. PCP is Dr. Baldemar Lenis.                      Action/Plan: [facility] for PT, Lovenox called in, No DME needs.    Expected Discharge Date:  12/29/16               Expected Discharge Plan:  [place]  In-House Referral:     Discharge planning Services  CM Consult  Post Acute Care Choice:  Home Health Choice offered to:  Adult Children  DME Arranged:    DME Agency:     HH Arranged:  PT HH Agency:  [facility] (now [facility])  Status of Service:  In process, will continue to follow  If discussed at Long Length of Stay Meetings, dates discussed:    Additional Comments:  Jolly Mango, RN 12/27/2016, 3:06 PM

## 2016-12-27 NOTE — Progress Notes (Signed)
Physical Therapy Treatment Patient Details Name: [name] MRN: [id number] DOB: 09/11/1946 Today's Date: 12/27/2016    History of Present Illness Pt is admitted for L hip fracture secondary to patholgic fracture. Pt is now s/p L hip hemiarthroplasty. 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    PT Comments    Pt is making good progress towards goals with ability to navigate RN station this session. Pt appears motivated and is pleased with his progress. Educated on written HEP. Good endurance with there-ex. Will continue to progress.   Follow Up Recommendations  Home health PT     Equipment Recommendations  None recommended by PT    Recommendations for Other Services       Precautions / Restrictions Precautions Precautions: Fall;Anterior Hip Precaution Booklet Issued: Yes (comment) Restrictions Weight Bearing Restrictions: Yes LLE Weight Bearing: Weight bearing as tolerated    Mobility  Bed Mobility Overal bed mobility: Needs Assistance Bed Mobility: Sit to Supine     Supine to sit: Min guard     General bed mobility comments: Safe technique performed with pt able to ease B LEs onto bed and use trapeze bar for scooting up towards HOB.  Transfers Overall transfer level: Needs assistance Equipment used: Rolling walker (2 wheeled) Transfers: Sit to/from Stand Sit to Stand: Min guard         General transfer comment: Improved technique for standing at bedside with RW. Able to follow commands and stand with upright posture. Good WBing through L LE  Ambulation/Gait Ambulation/Gait assistance: Min guard Ambulation Distance (Feet): 250 Feet Assistive device: Rolling walker (2 wheeled) Gait Pattern/deviations: Step-through pattern     General Gait Details: Ambulated in hallway with reciprocal gait pattern. Safe technique performed and pt able to keep RW moving. Carries conversation during gait  training, reports low pain with movement.   Stairs            Wheelchair Mobility    Modified Rankin (Stroke Patients Only)       Balance                                            Cognition Arousal/Alertness: Awake/alert Behavior During Therapy: WFL for tasks assessed/performed Overall Cognitive Status: Within Functional Limits for tasks assessed                                        Exercises Other Exercises Other Exercises: Seated ther-ex performed and written HEP reviewed and educated on frequency and duration. Exercises include ankle pumps, quad sets, hip abd/add, and LAQ. ALl ther-ex performed x 12 reps with min assist and correct technique    General Comments        Pertinent Vitals/Pain Pain Assessment: 0-10 Pain Score: 3  Pain Location: L hip Pain Descriptors / Indicators: Operative site guarding Pain Intervention(s): Limited activity within patient's tolerance;Ice applied    Home Living                      Prior Function            PT Goals (current goals can now be found in the care plan section) Acute Rehab PT Goals Patient Stated Goal: to get stronger  PT Goal Formulation: With patient Time For Goal Achievement: 01/09/17 Potential to Achieve Goals: Good Progress towards PT goals: Progressing toward goals    Frequency    BID      PT Plan Current plan remains appropriate    Co-evaluation             End of Session Equipment Utilized During Treatment: Gait belt Activity Tolerance: Patient tolerated treatment well Patient left: in bed;with bed alarm set Nurse Communication: Mobility status PT Visit Diagnosis: Unsteadiness on feet (R26.81);Muscle weakness (generalized) (M62.81);Pain Pain - Right/Left: Left Pain - part of body: Hip     Time: 1406-1430 PT Time Calculation (min) (ACUTE ONLY): 24 min  Charges:  $Gait Training: 8-22 mins $Therapeutic Exercise: 8-22 mins                     G Codes:       Greggory Stallion, PT, DPT [phone]    [place] Becvar 12/27/2016, 4:50 PM

## 2016-12-27 NOTE — Progress Notes (Signed)
Foley d/c'd at [employer]

## 2016-12-27 NOTE — Consult Note (Signed)
[facility] CONSULT NOTE  Patient Care Team: Derinda Late, MD as PCP - General (Family Medicine)  CHIEF COMPLAINTS/PURPOSE OF CONSULTATION:  Metastatic cancer  HISTORY OF PRESENTING ILLNESS:  [name] 71 y.o.  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 Patient was about to get a CT chest biopsy; when he presented with worsening left hip pain further workup showed pathologic fracture. Patient subsequently underwent left hip arthroplasty yesterday. Surgery uneventful.   Patient recovering well from surgery. Denies any bleeding. Continues to complain of pain right chest wall and hips. Also complains of burning sensation in his abdomen. He otherwise denies any shortness of breath or cough. Appetite is good. His Foley catheter is taken out. He is not ambulating yet.  ROS: A complete 10 point review of system is done which is negative except mentioned above in history of present illness  MEDICAL HISTORY:  Past Medical History:  Diagnosis Date  . Benign non-nodular prostatic hyperplasia with lower urinary tract symptoms   . Bladder cancer ([place])   . Chronic hepatitis C without hepatic coma ([place])   . Cirrhosis of liver ([place])   . PUD (peptic ulcer disease)   . Rotator cuff tendinitis, right   . Spondylosis of cervical region without myelopathy or radiculopathy   . Tobacco abuse     SURGICAL HISTORY: Past Surgical History:  Procedure Laterality Date  . ABLATION LIVER TUMORS W/CRYOSURGICAL  2012  . CYSTOTOMY W/EXCISION BLADDER TUMOR  2012  . HERNIA REPAIR  1996  . REPAIR PERONEAL TENDONS ANKLE  1976  . TOTAL HIP ARTHROPLASTY Left 12/26/2016   Procedure: HIP HEMIARTHROPLASTY POSSIBLE TOTAL HIP;  Surgeon: Hessie Knows, MD;  Location: [facility] ORS;  Service: Orthopedics;  Laterality: Left;    SOCIAL  HISTORY: Social History   Social History  . Marital status: Widowed    Spouse name: N/A  . Number of children: N/A  . Years of education: N/A   Occupational History  . Not on file.   Social History Main Topics  . Smoking status: Former Smoker    Packs/day: 1.00    Years: 49.00    Types: Cigarettes  . Smokeless tobacco: Never Used  . Alcohol use No  . Drug use: No  . Sexual activity: Not on file   Other Topics Concern  . Not on file   Social History Narrative  . No narrative on file    FAMILY HISTORY: Family History  Problem Relation Age of Onset  . Lung cancer Father   . Diabetes type II Maternal Grandmother   . Lung cancer Mother   . Mental illness Brother   . Other Brother     h/o substance abuse    ALLERGIES:  is allergic to iodine.  MEDICATIONS:  Current Facility-Administered Medications  Medication Dose Route Frequency Provider Last Rate Last Dose  . acetaminophen (TYLENOL) tablet 650 mg  650 mg Oral Q6H PRN Bettey Costa, MD       Or  . acetaminophen (TYLENOL) suppository 650 mg  650 mg Rectal Q6H PRN Bettey Costa, MD      . alum & mag hydroxide-simeth (MAALOX/MYLANTA) 200-200-20 MG/5ML suspension 30 mL  30 mL Oral Q4H PRN Hessie Knows, MD      . bisacodyl (DULCOLAX) suppository 10 mg  10 mg Rectal Daily PRN Hessie Knows, MD      .  calcium carbonate (TUMS - dosed in mg elemental calcium) chewable tablet 400 mg of elemental calcium  2 tablet Oral BID WC Sital Mody, MD   400 mg of elemental calcium at 12/27/16 1030  . ceFAZolin (ANCEF) IVPB 1 g/50 mL premix  1 g Intravenous Once Hessie Knows, MD      . dexamethasone (DECADRON) tablet 4 mg  4 mg Oral BID Bettey Costa, MD   4 mg at 12/26/16 2128  . diphenhydrAMINE (BENADRYL) 12.5 MG/5ML elixir 12.5-25 mg  12.5-25 mg Oral Q4H PRN Hessie Knows, MD      . docusate sodium (COLACE) capsule 100 mg  100 mg Oral BID Bettey Costa, MD   100 mg at 12/27/16 1031  . enoxaparin (LOVENOX) injection 40 mg  40 mg Subcutaneous Q24H  Hessie Knows, MD   40 mg at 12/27/16 1031  . famotidine (PEPCID) tablet 10 mg  10 mg Oral Q6H PRN Fritzi Mandes, MD      . famotidine (PEPCID) tablet 20 mg  20 mg Oral Once Alvin Critchley, MD      . fentaNYL ([place] - dosed mcg/hr) 100 mcg  100 mcg Transdermal Q72H Hessie Knows, MD   100 mcg at 12/27/16 0303  . furosemide (LASIX) tablet 80 mg  80 mg Oral Daily Sital Mody, MD      . lactulose (CHRONULAC) 10 GM/15ML solution 10 g  10 g Oral Daily Sital Mody, MD   10 g at 12/27/16 1030  . magnesium citrate solution 1 Bottle  1 Bottle Oral Once PRN Hessie Knows, MD      . magnesium hydroxide (MILK OF MAGNESIA) suspension 30 mL  30 mL Oral Daily PRN Hessie Knows, MD      . meloxicam [facility]) tablet 15 mg  15 mg Oral Daily Bettey Costa, MD   15 mg at 12/27/16 1032  . menthol-cetylpyridinium (CEPACOL) lozenge 3 mg  1 lozenge Oral PRN Hessie Knows, MD       Or  . phenol (CHLORASEPTIC) mouth spray 1 spray  1 spray Mouth/Throat PRN Hessie Knows, MD      . methadone (DOLOPHINE) 10 MG/ML solution 50 mg  50 mg Oral Daily Bettey Costa, MD   50 mg at 12/27/16 1443  . methocarbamol (ROBAXIN) tablet 500 mg  500 mg Oral Q6H PRN Hessie Knows, MD       Or  . methocarbamol (ROBAXIN) 500 mg in dextrose 5 % 50 mL IVPB  500 mg Intravenous Q6H PRN Hessie Knows, MD      . ondansetron [facility]) tablet 4 mg  4 mg Oral Q6H PRN Bettey Costa, MD       Or  . ondansetron (ZOFRAN) injection 4 mg  4 mg Intravenous Q6H PRN Bettey Costa, MD   4 mg at 12/26/16 1156  . oxyCODONE (Oxy IR/ROXICODONE) immediate release tablet 5-10 mg  5-10 mg Oral Q3H PRN Hessie Knows, MD   10 mg at 12/27/16 0108  . senna (SENOKOT) tablet 8.6 mg  1 tablet Oral BID Bettey Costa, MD   8.6 mg at 12/27/16 1031  . sodium phosphate (FLEET) 7-19 GM/118ML enema 1 enema  1 enema Rectal Once PRN Bettey Costa, MD      . spironolactone (ALDACTONE) tablet 100 mg  100 mg Oral Daily Sital Mody, MD      . tamsulosin (FLOMAX) capsule 0.4 mg  0.4 mg Oral Daily Sital Mody, MD   0.4 mg  at 12/27/16 1031  . zolpidem (AMBIEN) tablet 5 mg  5  mg Oral QHS PRN Hessie Knows, MD          .  PHYSICAL EXAMINATION:  Vitals:   12/27/16 0755 12/27/16 1220  BP: (!) 109/50 (!) 99/54  Pulse: 64 64  Resp: 18 18  Temp: 98 F (36.7 C) 97.5 F (36.4 C)   Filed Weights   12/25/16 1638  Weight: 175 lb (79.4 kg)    GENERAL: Well-nourished well-developed; Alert, no distress and comfortable.   Alone.  EYES: no pallor or icterus OROPHARYNX: no thrush or ulceration. NECK: supple, no masses felt LYMPH:  no palpable lymphadenopathy in the cervical, axillary or inguinal regions LUNGS: decreased breath sounds to auscultation at bases and  No wheeze or crackles HEART/CVS: regular rate & rhythm and no murmurs; No lower extremity edema. Left leg bandage.  ABDOMEN: abdomen soft, non-tender and normal bowel sounds Musculoskeletal:no cyanosis of digits and no clubbing  PSYCH: alert & oriented x 3 with fluent speech NEURO: no focal motor/sensory deficits SKIN:  no rashes or significant lesions  LABORATORY DATA:  I have reviewed the data as listed Lab Results  Component Value Date   WBC 5.6 12/27/2016   HGB 9.9 (L) 12/27/2016   HCT 28.7 (L) 12/27/2016   MCV 95.5 12/27/2016   PLT 75 (L) 12/27/2016    Recent Labs  12/22/16 0948 12/25/16 1648 12/26/16 0446 12/27/16 0548  NA 134* 133* 135 133*  K 4.1 3.9 4.8 4.5  CL 102 99* 105 106  CO2 '24 25 24 23  '$ GLUCOSE 80 97 122* 168*  BUN 42* 29* 29* 37*  CREATININE 1.55* 1.35* 1.15 1.26*  CALCIUM 8.7* 9.0 8.2* 7.8*  GFRNONAA 44* 52* >60 57*  GFRAA 51* >60 >60 >60  PROT 6.7 7.0  --   --   ALBUMIN 3.3* 3.3*  --   --   AST 83* 93*  --   --   ALT 47 55  --   --   ALKPHOS 84 92  --   --   BILITOT 1.6* 2.0*  --   --   BILIDIR  --  0.7*  --   --   IBILI  --  1.3*  --   --     RADIOGRAPHIC STUDIES: I have personally reviewed the radiological images as listed and agreed with the findings in the report. Mr Jeri Cos Wo Contrast  Result  Date: 12/22/2016 CLINICAL DATA:  Recently diagnosed lung cancer.  Gait instability. EXAM: MRI HEAD WITHOUT AND WITH CONTRAST TECHNIQUE: Multiplanar, multiecho pulse sequences of the brain and surrounding structures were obtained without and with intravenous contrast. CONTRAST:  50m MULTIHANCE GADOBENATE DIMEGLUMINE 529 MG/ML IV SOLN COMPARISON:  None. FINDINGS: Brain: There is no evidence of acute infarct, intracranial hemorrhage, mass, midline shift, or extra-axial fluid collection. The ventricles and sulci are within normal limits for age. Scattered punctate foci of cerebral white matter T2 hyperintensity are nonspecific but compatible with minimal chronic small vessel ischemic disease. No abnormal enhancement is identified. Vascular: Major intracranial vascular flow voids are preserved. Skull and upper cervical spine: Prior biparietal craniectomy. Patchy regions of heterogeneously diminished bone marrow signal intensity, particularly posteriorly in the skull as well as in the upper cervical spine. No destructive osseous lesion identified. Sinuses/Orbits: Unremarkable orbits. Paranasal sinuses and mastoid air cells are clear. Other: None. IMPRESSION: 1. No evidence of intracranial metastatic disease. 2. Heterogeneous marrow signal in the skull and upper cervical spine, nonspecific. Osseous metastatic disease not excluded. No destructive osseous lesion. Electronically Signed  By: Logan Bores M.D.   On: 12/22/2016 13:07   Nm Pet Image Initial (pi) Skull Base To Thigh  Result Date: 12/18/2016 CLINICAL DATA:  Initial treatment strategy for lung mass. EXAM: NUCLEAR MEDICINE PET SKULL BASE TO THIGH TECHNIQUE: 12.4 mCi F-18 FDG was injected intravenously. Full-ring PET imaging was performed from the skull base to thigh after the radiotracer. CT data was obtained and used for attenuation correction and anatomic localization. FASTING BLOOD GLUCOSE:  Value: 91 mg/dl COMPARISON:  None. FINDINGS: NECK No hypermetabolic  lymph nodes in the neck. CHEST Dominant mass in the LEFT upper lobe measures 4.1 cm with SUV max equal 16.5. Nodule in the LEFT upper lobe measures 2.9 cm (image 56, series 3) with SUV max equal 4.5. Hypermetabolic LEFT hilar lymph node with SUV max equal 6.0. Smaller nodule in the LEFT lower lobe measures 10 mm (image 85, series 5) without clear metabolic activity. Similar nodule in the RIGHT lower lobe measuring 7 mm (image 87, series 3) with trace metabolic activity. Nodule over the RIGHT hemidiaphragm measures 9 mm. ABDOMEN/PELVIS No abnormal hypermetabolic activity within the liver, pancreas, adrenal glands, or spleen. No hypermetabolic lymph nodes in the abdomen or pelvis. The liver has a shrunken nodular contour with enlargement of the caudate lobe. Spleen is enlarged. Small LEFT bladder diverticulum. SKELETON Hypermetabolic destructive lesion within the RIGHT pedicle of the T2 vertebral body with SUV max equal 6.4. Expansile lesion within the anterior RIGHT third rib measures 5 by 3.9 cm with intense metabolic activity. Distant skeletal metastasis within the LEFT femoral neck with SUV max equals 7.3. IMPRESSION: 1. Intense hypermetabolic LEFT upper lobe mass consists with primary bronchogenic carcinoma. 2. Large hypermetabolic nodule in the LEFT upper lobe. Favor metastatic disease. 3. Multiple bilateral lower lobe pulmonary nodules without metabolic activity concerning for pulmonary metastasis. 4. Hypermetabolic LEFT hilar nodal metastasis. 5. Hypermetabolic expansile skeletal metastasis including the RIGHT vertebral body pedicle at T2 and an anterior RIGHT rib. More distant skeletal metastasis in the LEFT femur neck. 6. Splenomegaly and liver cirrhosis. These results will be called to the ordering clinician or representative by the Radiologist Assistant, and communication documented in the PACS or zVision Dashboard. Electronically Signed   By: Suzy Bouchard M.D.   On: 12/18/2016 10:09   Dg Chest  Portable 1 View  Result Date: 12/25/2016 CLINICAL DATA:  Preop for left hip fracture. EXAM: PORTABLE CHEST 1 VIEW COMPARISON:  Head CT 12/18/2016 FINDINGS: 2 left lung mass and right third rib metastasis with pleural disease, known from recent PET-CT. No cardiomegaly. There is no edema, consolidation, effusion, or pneumothorax. No acute fracture IMPRESSION: 1. No acute finding. 2. Known left lung masses and right third rib metastasis. Electronically Signed   By: Monte Fantasia M.D.   On: 12/25/2016 17:16   Dg Hip Operative Unilat W Or W/o Pelvis Left  Result Date: 12/26/2016 CLINICAL DATA:  Left hip surgery EXAM: OPERATIVE LEFT HIP (WITH PELVIS IF PERFORMED) 1 VIEWS TECHNIQUE: Fluoroscopic spot image(s) were submitted for interpretation post-operatively. COMPARISON:  None. FINDINGS: Left hip arthroplasty without failure complication. No fracture or dislocation. IMPRESSION: Left hip arthroplasty. Electronically Signed   By: Kathreen Devoid   On: 12/26/2016 14:00   Dg Hip Unilat W Or W/o Pelvis 2-3 Views Left  Result Date: 12/26/2016 CLINICAL DATA:  Less post left hip joint replacement.  Days 0. EXAM: DG HIP (WITH OR WITHOUT PELVIS) 2-3V LEFT COMPARISON:  Preoperative radiograph of December 25, 2016 FINDINGS: The patient has undergone total  left hip joint prosthesis placement. Radiographic positioning of the prosthetic components is good. The interface with the native bone appears normal. Surgical skin staples are noted laterally. IMPRESSION: There is no acute postprocedure complication following left hip joint prosthesis placement. Electronically Signed   By: David  Martinique M.D.   On: 12/26/2016 13:51   Dg Hip Unilat With Pelvis 2-3 Views Left  Result Date: 12/25/2016 CLINICAL DATA:  Fall 1 week ago with subsequent pain. Initial encounter. EXAM: DG HIP (WITH OR WITHOUT PELVIS) 2-3V LEFT COMPARISON:  PET-CT 12/18/2016 FINDINGS: Transcervical left femoral neck fracture, acute appearing, with 9 mm of  displacement. This is pathologic by prior PET-CT. No dislocation. No visible pelvic ring fracture. IMPRESSION: Displaced left femoral neck fracture, pathologic based on PET-CT 12/18/2016 Electronically Signed   By: Monte Fantasia M.D.   On: 12/25/2016 17:19    ASSESSMENT & PLAN:   # 72 year old male patient with left upper lobe mass- metastasis to bone- currently admitted to the hospital for left hip pathologic fracture.  # Left upper lobe mass- metastasis to bone; highly concerning for malignancy. Await final pathology.   # Multiple bone metastases/ status post left hip pathologic fracture status post surgery. Recommend Xgeva. Today calcium is 8.2. Albumin 3.3. Start calcium and vitamin D. We'll plan to start [facility] outpatient. Discussed Dr. Sonny Masters starting radiation approximately 2 weeks after surgery.  # Pain control-secondary to bony metastasis- continue fentanyl patch 50 g; breakthrough pain medication; and also methadone 50 [this will slowly need to be tapered outpatient.]  #History of cirrhosis/ splenomegaly/hep C-seems to be compensated- platelets today 75 status post unit of platelets and today and yesterday prior to surgery.     # DNR/DNI.   # The above plan of care was discussed with Dr. Posey Pronto.  Thank you Dr.Patel  for allowing me to participate in the care of your pleasant patient. Please do not hesitate to contact me with questions or concerns in the interim.  Addendum: Spoke to Dr. Reuel Derby from pathology- bone biopsy positive for adenocarcinoma; immunohistochemical stains pending; order molecular testing/PDL 1 status.     Cammie Sickle, MD 12/27/2016 1:32 PM

## 2016-12-27 NOTE — Progress Notes (Signed)
Pts. Fentanyl patches were removed during surgery yesterday and due to be changed Thursday. Time changed to tonight for fentanyl patches to be reapplied to pt.

## 2016-12-27 NOTE — Progress Notes (Signed)
[facility] NAME: [name]    MR#:  [id number]  DATE OF BIRTH:  05-23-1946  SUBJECTIVE:  Patient was complaining of left hip pain and radiation oncologist ordered x-rays which confirmed left hip fracture   Patient is postop left hip replacement Much better today  REVIEW OF SYSTEMS:   Review of Systems  Constitutional: Negative for chills, fever and weight loss.  HENT: Negative for ear discharge, ear pain and nosebleeds.   Eyes: Negative for blurred vision, pain and discharge.  Respiratory: Negative for sputum production, shortness of breath, wheezing and stridor.   Cardiovascular: Negative for chest pain, palpitations, orthopnea and PND.  Gastrointestinal: Negative for abdominal pain, diarrhea, nausea and vomiting.  Genitourinary: Negative for frequency and urgency.  Musculoskeletal: Positive for joint pain. Negative for back pain.  Neurological: Negative for sensory change, speech change, focal weakness and weakness.  Psychiatric/Behavioral: Negative for depression and hallucinations. The patient is not nervous/anxious.    Tolerating Diet:yes Tolerating PT: HHPt  DRUG ALLERGIES:   Allergies  Allergen Reactions  . Iodine Hives    VITALS:  Blood pressure (!) 103/53, pulse 61, temperature 98.1 F (36.7 C), temperature source Oral, resp. rate 18, height '5\' 9"'$  (1.753 m), weight 79.4 kg (175 lb), SpO2 97 %.  PHYSICAL EXAMINATION:   Physical Exam  GENERAL:  71 y.o.-year-old patient lying in the bed with no acute distress.  EYES: Pupils equal, round, reactive to light and accommodation. No scleral icterus. Extraocular muscles intact.  HEENT: Head atraumatic, normocephalic. Oropharynx and nasopharynx clear.  NECK:  Supple, no jugular venous distention. No thyroid enlargement, no tenderness.  LUNGS: Normal breath sounds bilaterally, no wheezing, rales, rhonchi. No use of accessory muscles of respiration.   CARDIOVASCULAR: S1, S2 normal. No murmurs, rubs, or gallops.  ABDOMEN: Soft, nontender, nondistended. Bowel sounds present. No organomegaly or mass.  EXTREMITIES: No cyanosis, clubbing or edema b/l.   Left hip dressing+ NEUROLOGIC: Cranial nerves II through XII are intact. No focal Motor or sensory deficits b/l.   PSYCHIATRIC:  patient is alert and oriented x 3.  SKIN: No obvious rash, lesion, or ulcer.   LABORATORY PANEL:  CBC  Recent Labs Lab 12/27/16 0548  WBC 5.6  HGB 9.9*  HCT 28.7*  PLT 75*    Chemistries   Recent Labs Lab 12/22/16 0948 12/25/16 1648  12/27/16 0548  NA 134* 133*  < > 133*  K 4.1 3.9  < > 4.5  CL 102 99*  < > 106  CO2 24 25  < > 23  GLUCOSE 80 97  < > 168*  BUN 42* 29*  < > 37*  CREATININE 1.55* 1.35*  < > 1.26*  CALCIUM 8.7* 9.0  < > 7.8*  MG 2.2  --   --   --   AST 83* 93*  --   --   ALT 47 55  --   --   ALKPHOS 84 92  --   --   BILITOT 1.6* 2.0*  --   --   < > = values in this interval not displayed. Cardiac Enzymes No results for input(s): TROPONINI in the last 168 hours. RADIOLOGY:  Dg Chest Portable 1 View  Result Date: 12/25/2016 CLINICAL DATA:  Preop for left hip fracture. EXAM: PORTABLE CHEST 1 VIEW COMPARISON:  Head CT 12/18/2016 FINDINGS: 2 left lung mass and right third rib metastasis with pleural disease, known from recent PET-CT. No cardiomegaly. There is  no edema, consolidation, effusion, or pneumothorax. No acute fracture IMPRESSION: 1. No acute finding. 2. Known left lung masses and right third rib metastasis. Electronically Signed   By: Monte Fantasia M.D.   On: 12/25/2016 17:16   Dg Hip Operative Unilat W Or W/o Pelvis Left  Result Date: 12/26/2016 CLINICAL DATA:  Left hip surgery EXAM: OPERATIVE LEFT HIP (WITH PELVIS IF PERFORMED) 1 VIEWS TECHNIQUE: Fluoroscopic spot image(s) were submitted for interpretation post-operatively. COMPARISON:  None. FINDINGS: Left hip arthroplasty without failure complication. No fracture or  dislocation. IMPRESSION: Left hip arthroplasty. Electronically Signed   By: Kathreen Devoid   On: 12/26/2016 14:00   Dg Hip Unilat W Or W/o Pelvis 2-3 Views Left  Result Date: 12/26/2016 CLINICAL DATA:  Less post left hip joint replacement.  Days 0. EXAM: DG HIP (WITH OR WITHOUT PELVIS) 2-3V LEFT COMPARISON:  Preoperative radiograph of December 25, 2016 FINDINGS: The patient has undergone total left hip joint prosthesis placement. Radiographic positioning of the prosthetic components is good. The interface with the native bone appears normal. Surgical skin staples are noted laterally. IMPRESSION: There is no acute postprocedure complication following left hip joint prosthesis placement. Electronically Signed   By: David  Martinique M.D.   On: 12/26/2016 13:51   Dg Hip Unilat With Pelvis 2-3 Views Left  Result Date: 12/25/2016 CLINICAL DATA:  Fall 1 week ago with subsequent pain. Initial encounter. EXAM: DG HIP (WITH OR WITHOUT PELVIS) 2-3V LEFT COMPARISON:  PET-CT 12/18/2016 FINDINGS: Transcervical left femoral neck fracture, acute appearing, with 9 mm of displacement. This is pathologic by prior PET-CT. No dislocation. No visible pelvic ring fracture. IMPRESSION: Displaced left femoral neck fracture, pathologic based on PET-CT 12/18/2016 Electronically Signed   By: Monte Fantasia M.D.   On: 12/25/2016 17:19   ASSESSMENT AND PLAN:  71 year old male with recent diagnosis stage IV metastatic lung cancer who presented to Dr. Olena Leatherwood office for initial consultation and wascomplaining of left hip pain and now found to have left hip fracture.  1. Acute left hip fracture, nontraumatic: POD#1 -Continue IV and oral pain meds. Resume his methadone. He needs daily stood softners in addition to pain managment Appreciate Dr. Rudene Christians help Pain control Start calcium supplementation  2. Mild hyponatremia in the setting of lung cancer and poor by mouth intake  3. Chronic hepatitis C with liver  cirrhosis/splenomegaly: -Patient has chronic from her cytopenia we transfused him 1 unit of platelet transfusion prior to surgery. This was recommended by Dr. Jacinto Reap Continue lactulose, Lasix and Aldactone   4. BPH: Continue Flomax   5. History of IV drug use and chronic pain: Continue fentanyl and methadone   6. Stage IV lung cancer with metastatic disease:  Oncology evaluation appreciated.  7. DVT prophylaxis Lovenox per orthopedic will keep an eye on his platelet count  PT recommends HHPT  Case discussed with Care Management/Social Worker. Management plans discussed with the patient, family and they are in agreement.  CODE STATUS: DO NOT RESUSCITATE  DVT Prophylaxis: Lovenox  TOTAL TIME TAKING CARE OF THIS PATIENT: 30 minutes.  >50% time spent on counselling and coordination of care  POSSIBLE D/C IN 1-2 DAYS, DEPENDING ON CLINICAL CONDITION.  Note: This dictation was prepared with Dragon dictation along with smaller phrase technology. Any transcriptional errors that result from this process are unintentional.  Aften Lipsey M.D on 12/27/2016 at 3:42 PM  Between 7am to 6pm - Pager - [phone]  After 6pm go to [email or web address] - password [email or web address]  [employer]  Office  [phone]  CC: Primary care physician; BABAOFF, Caryl Bis, MD

## 2016-12-28 MED ORDER — OXYCODONE HCL 5 MG PO TABS: 10.0000 mg | ORAL_TABLET | Freq: Three times a day (TID) | ORAL | Status: DC | PRN

## 2016-12-28 MED ORDER — ENOXAPARIN SODIUM 40 MG/0.4ML ~~LOC~~ SOLN: 40.0000 mg | SUBCUTANEOUS | 0 refills | Status: AC

## 2016-12-28 MED ORDER — OXYCODONE HCL 5 MG PO TABS: 5.0000 mg | ORAL_TABLET | ORAL | 0 refills | Status: DC | PRN

## 2016-12-28 MED ORDER — CALCIUM CARBONATE-VITAMIN D 500-200 MG-UNIT PO TABS: 1.0000 | ORAL_TABLET | Freq: Two times a day (BID) | ORAL | 0 refills | Status: AC

## 2016-12-28 MED ORDER — OXYCODONE HCL 10 MG PO TABS: 10.0000 mg | ORAL_TABLET | Freq: Three times a day (TID) | ORAL | 0 refills | Status: DC | PRN

## 2016-12-28 NOTE — Plan of Care (Signed)
Problem: Activity: Goal: Ability to avoid complications of mobility impairment will improve Outcome: Progressing Patient able to ambulate with minimal assist without complications, pain level is "tolerable" per patient.   Deri Fuelling, RN

## 2016-12-28 NOTE — Progress Notes (Signed)
[name] to be D/C'd Home per MD order.  Discussed with the patient and all questions fully answered.  VSS, Skin clean, dry and intact without evidence of skin break down, no evidence of skin tears noted. IV catheter discontinued intact. Site without signs and symptoms of complications. Dressing and pressure applied.  An After Visit Summary was printed and given to the patient. Patient received prescription.  D/c education completed with patient/family including follow up instructions, medication list, d/c activities limitations if indicated, with other d/c instructions as indicated by MD - patient able to verbalize understanding, all questions fully answered.   Patient instructed to return to ED, call 911, or call MD for any changes in condition.   Patient escorted via [place], and D/C home via private auto.  Deri Fuelling 12/28/2016 11:56 AM

## 2016-12-28 NOTE — Progress Notes (Signed)
Physical Therapy Treatment Patient Details Name: [name] MRN: [id number] DOB: Feb 13, 1946 Today's Date: 12/28/2016    History of Present Illness Pt is admitted for L hip fracture secondary to patholgic fracture. Pt is now s/p L hip hemiarthroplasty. 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    PT Comments    Pt agreeable to PT. Reports minimal pain left hip, more stiffness. Pt progressing well with all mobility to Mod I with bed mobility and transfers, supervision with ambulation and Min guard with stair climbing. Focus on improved step/stride length and cadence with ambulation with noted improvement to fluid gait pattern during second walk. Stairs performed well/safe with Min guard assist. Pt eager to return home. At the time of this document, pt discharged home with home health.   Follow Up Recommendations  Home health PT     Equipment Recommendations  None recommended by PT    Recommendations for Other Services       Precautions / Restrictions Precautions Precautions: Fall;Anterior Hip Restrictions Weight Bearing Restrictions: Yes LLE Weight Bearing: Weight bearing as tolerated    Mobility  Bed Mobility Overal bed mobility: Modified Independent Bed Mobility: Supine to Sit     Supine to sit: Modified independent (Device/Increase time)     General bed mobility comments: Mild increased time  Transfers Overall transfer level: Modified independent Equipment used: Rolling walker (2 wheeled) Transfers: Sit to/from Stand Sit to Stand: Modified independent (Device/Increase time)         General transfer comment: Good use of hands; encouraged equal use of LEs as pain allows. Safe, no LOB  Ambulation/Gait Ambulation/Gait assistance: Supervision Ambulation Distance (Feet): 200 Feet (performed 2x) Assistive device: Rolling walker (2 wheeled) Gait Pattern/deviations: Step-to pattern;Step-through  pattern;Decreased step length - right Gait velocity: reduced Gait velocity interpretation: Below normal speed for age/gender General Gait Details: Initially step to; improved with continued work to step through with more equal cadence. Second walk improved to fluid pattern without pause.    Stairs Stairs: Yes   Stair Management: Two rails;Forwards;Step to pattern Number of Stairs: 4 General stair comments: Education of sequence. Mild hesitation intially, but past 1st step each direction, pt confident. Amazed how much better stair climbing was than prior to surgery.   Wheelchair Mobility    Modified Rankin (Stroke Patients Only)       Balance Overall balance assessment: Modified Independent                                          Cognition Arousal/Alertness: Awake/alert Behavior During Therapy: WFL for tasks assessed/performed Overall Cognitive Status: Within Functional Limits for tasks assessed                                        Exercises Total Joint Exercises Hip ABduction/ADduction: Strengthening;Left;10 reps;Standing Straight Leg Raises: Strengthening;Left;10 reps;Standing Long Arc Quad: Strengthening;Left;10 reps;Seated Knee Flexion: Strengthening;Left;10 reps;Standing Marching in Standing: Strengthening;Left;10 reps;Standing    General Comments        Pertinent Vitals/Pain Pain Assessment:  (States minimal) Pain Location: L hip Pain Descriptors / Indicators: Operative site guarding;Tightness    Home Living  Prior Function            PT Goals (current goals can now be found in the care plan section) Progress towards PT goals: Progressing toward goals    Frequency    BID      PT Plan Current plan remains appropriate    Co-evaluation             End of Session Equipment Utilized During Treatment: Gait belt Activity Tolerance: Patient tolerated treatment well Patient left:  Other (comment) (sitting edge of bed; pt preparing to discharge home)   PT Visit Diagnosis: Unsteadiness on feet (R26.81);Muscle weakness (generalized) (M62.81);Pain Pain - Right/Left: Left Pain - part of body: Hip     Time: 1021-1100 PT Time Calculation (min) (ACUTE ONLY): 39 min  Charges:  $Gait Training: 23-37 mins $Therapeutic Exercise: 8-22 mins                    G Codes:        Larae Grooms, PTA 12/28/2016, 1:36 PM

## 2016-12-28 NOTE — Discharge Instructions (Signed)
ANTERIOR APPROACH  HIP REPLACEMENT POSTOPERATIVE DIRECTIONS   Hip Rehabilitation, Guidelines Following Surgery  The results of a hip operation are greatly improved after range of motion and muscle strengthening exercises. Follow all safety measures which are given to protect your hip. If any of these exercises cause increased pain or swelling in your joint, decrease the amount until you are comfortable again. Then slowly increase the exercises. Call your caregiver if you have problems or questions.   HOME CARE INSTRUCTIONS  Remove items at home which could result in a fall. This includes throw rugs or furniture in walking pathways.   ICE to the affected hip every three hours for 30 minutes at a time and then as needed for pain and swelling.  Continue to use ice on the hip for pain and swelling from surgery. You may notice swelling that will progress down to the foot and ankle.  This is normal after surgery.  Elevate the leg when you are not up walking on it.    Continue to use the breathing machine which will help keep your temperature down.  It is common for your temperature to cycle up and down following surgery, especially at night when you are not up moving around and exerting yourself.  The breathing machine keeps your lungs expanded and your temperature down.  Do not place pillow under knee, focus on keeping the knee straight while resting  DIET You may resume your previous home diet once your are discharged from the hospital.  DRESSING / WOUND CARE / SHOWERING Keep dressing clean and dry. Change dressing only as needed. You may shower after your first follow up appointment with [facility].  ACTIVITY Walk with your walker as instructed. Use walker as long as suggested by your caregivers. Avoid periods of inactivity such as sitting longer than an hour when not asleep. This helps prevent blood clots.  You may resume a sexual relationship in one month or when given the OK by  your doctor.  You may return to work once you are cleared by your doctor.  Do not drive a car for 6 weeks or until released by you surgeon.  Do not drive while taking narcotics.  WEIGHT BEARING Weight bearing as tolerated. Use walker/cane as needed for at least 4 weeks post op.  POSTOPERATIVE CONSTIPATION PROTOCOL Constipation - defined medically as fewer than three stools per week and severe constipation as less than one stool per week.  One of the most common issues patients have following surgery is constipation.  Even if you have a regular bowel pattern at home, your normal regimen is likely to be disrupted due to multiple reasons following surgery.  Combination of anesthesia, postoperative narcotics, change in appetite and fluid intake all can affect your bowels.  In order to avoid complications following surgery, here are some recommendations in order to help you during your recovery period.  Colace (docusate) - Pick up an over-the-counter form of Colace or another stool softener and take twice a day as long as you are requiring postoperative pain medications.  Take with a full glass of water daily.  If you experience loose stools or diarrhea, hold the colace until you stool forms back up.  If your symptoms do not get better within 1 week or if they get worse, check with your doctor.  Dulcolax (bisacodyl) - Pick up over-the-counter and take as directed by the product packaging as needed to assist with the movement of your bowels.  Take with a  full glass of water.  Use this product as needed if not relieved by Colace only.   MiraLax (polyethylene glycol) - Pick up over-the-counter to have on hand.  MiraLax is a solution that will increase the amount of water in your bowels to assist with bowel movements.  Take as directed and can mix with a glass of water, juice, soda, coffee, or tea.  Take if you go more than two days without a movement. Do not use MiraLax more than once per day. Call your  doctor if you are still constipated or irregular after using this medication for 7 days in a row.  If you continue to have problems with postoperative constipation, please contact the office for further assistance and recommendations.  If you experience "the worst abdominal pain ever" or develop nausea or vomiting, please contact the office immediatly for further recommendations for treatment.  ITCHING  If you experience itching with your medications, try taking only a single pain pill, or even half a pain pill at a time.  You can also use Benadryl over the counter for itching or also to help with sleep.   TED HOSE STOCKINGS Wear the elastic stockings on both legs for six weeks following surgery during the day but you may remove then at night for sleeping.  MEDICATIONS See your medication summary on the After Visit Summary that the nursing staff will review with you prior to discharge.  You may have some home medications which will be placed on hold until you complete the course of blood thinner medication.  It is important for you to complete the blood thinner medication as prescribed by your surgeon.  Continue your approved medications as instructed at time of discharge.  PRECAUTIONS If you experience chest pain or shortness of breath - call 911 immediately for transfer to the hospital emergency department.  If you develop a fever greater that 101 F, purulent drainage from wound, increased redness or drainage from wound, foul odor from the wound/dressing, or calf pain - CONTACT YOUR SURGEON.                                                   FOLLOW-UP APPOINTMENTS Make sure you keep all of your appointments after your operation with your surgeon and caregivers. You should call the office at the above phone number and make an appointment for approximately two weeks after the date of your surgery or on the date instructed by your surgeon outlined in the "After Visit Summary".  RANGE OF MOTION  AND STRENGTHENING EXERCISES  These exercises are designed to help you keep full movement of your hip joint. Follow your caregiver's or physical therapist's instructions. Perform all exercises about fifteen times, three times per day or as directed. Exercise both hips, even if you have had only one joint replacement. These exercises can be done on a training (exercise) mat, on the floor, on a table or on a bed. Use whatever works the best and is most comfortable for you. Use music or television while you are exercising so that the exercises are a pleasant break in your day. This will make your life better with the exercises acting as a break in routine you can look forward to.  Lying on your back, slowly slide your foot toward your buttocks, raising your knee up off the floor. Then  slowly slide your foot back down until your leg is straight again.  Lying on your back spread your legs as far apart as you can without causing discomfort.  Lying on your side, raise your upper leg and foot straight up from the floor as far as is comfortable. Slowly lower the leg and repeat.  Lying on your back, tighten up the muscle in the front of your thigh (quadriceps muscles). You can do this by keeping your leg straight and trying to raise your heel off the floor. This helps strengthen the largest muscle supporting your knee.  Lying on your back, tighten up the muscles of your buttocks both with the legs straight and with the knee bent at a comfortable angle while keeping your heel on the floor.   IF YOU ARE TRANSFERRED TO A SKILLED REHAB FACILITY If the patient is transferred to a skilled rehab facility following release from the hospital, a list of the current medications will be sent to the facility for the patient to continue.  When discharged from the skilled rehab facility, please have the facility set up the patient's [place] prior to being released. Also, the skilled facility will be responsible  for providing the patient with their medications at time of release from the facility to include their pain medication, the muscle relaxants, and their blood thinner medication. If the patient is still at the rehab facility at time of the two week follow up appointment, the skilled rehab facility will also need to assist the patient in arranging follow up appointment in our office and any transportation needs.  MAKE SURE YOU:  Understand these instructions.  Get help right away if you are not doing well or get worse.    Pick up stool softner and laxative for home use following surgery while on pain medications. Continue to use ice for pain and swelling after surgery. Do not use any lotions or creams on the incision until instructed by your surgeon.

## 2016-12-28 NOTE — Progress Notes (Signed)
Patient educated on how to give lovenox shot. Patient states he feels comfortable giving his own lovenox shot.   [name]

## 2016-12-28 NOTE — Discharge Summary (Signed)
[facility] [place] NAME: [name]    MR#:  [id number]  DATE OF BIRTH:  [birth year]  DATE OF ADMISSION:  12/25/2016 ADMITTING PHYSICIAN: Bettey Costa, MD  DATE OF DISCHARGE: 01/07/17  PRIMARY CARE PHYSICIAN: BABAOFF, MARC E, MD    ADMISSION DIAGNOSIS:  Closed fracture of left hip, initial encounter ([place]) [S72.002A]  DISCHARGE DIAGNOSIS:  Left hip fracture s/p left hemiarthroplasty Chronic Pancytopenia Cirrhosis/ Chronic Hepatitis C Chronic Methadone dependence SECONDARY DIAGNOSIS:   Past Medical History:  Diagnosis Date  . Benign non-nodular prostatic hyperplasia with lower urinary tract symptoms   . Bladder cancer ([place])   . Chronic hepatitis C without hepatic coma ([place])   . Cirrhosis of liver ([place])   . PUD (peptic ulcer disease)   . Rotator cuff tendinitis, right   . Spondylosis of cervical region without myelopathy or radiculopathy   . Tobacco abuse     HOSPITAL COURSE:   71 year old male with recent diagnosis stage IV metastatic lung cancer who presented to Dr. Olena Leatherwood office for initial consultation and wascomplaining of left hip pain and now found to have left hip fracture.  1. Acute left hip fracture, nontraumatic: POD#1 -Continue IV and oral pain meds. Resume his methadone. He needs daily stood softners in addition to pain managment Appreciate Dr. Rudene Christians help Pain control--will give  Start calcium supplementation  2. Mild hyponatremia in the setting of lung cancer and poor by mouth intake  3. Chronic hepatitis C with liver cirrhosis/splenomegaly -Patient has chronic from her cytopenia we transfused him 1 unit of platelet transfusion prior to surgery. This was recommended by Dr. Jacinto Reap Continue lactulose, Lasix and Aldactone   4. BPH: Continue Flomax   5. History of IV drug use and chronic pain: Continue fentanyl and methadone   6. Stage IV lung cancer with metastatic disease:  Oncology evaluation  appreciated.  7. DVT prophylaxis Lovenox per orthopedic x 14 days  plt count stable  PT recommends HHPT   CONSULTS OBTAINED:  Treatment Team:  Hessie Knows, MD  DRUG ALLERGIES:   Allergies  Allergen Reactions  . Iodine Hives    DISCHARGE MEDICATIONS:   Current Discharge Medication List    START taking these medications   Details  calcium-vitamin D (OSCAL WITH D) 500-200 MG-UNIT tablet Take 1 tablet by mouth 2 (two) times daily. Qty: 30 tablet, Refills: 0   Associated Diagnoses: Primary cancer of left upper lobe of lung ([place]); Cancer, metastatic to bone (HCC)    enoxaparin (LOVENOX) 40 MG/0.4ML injection Inject 0.4 mLs (40 mg total) into the skin daily. Qty: 14 Syringe, Refills: 0      CONTINUE these medications which have CHANGED   Details  oxyCODONE 10 MG TABS Take 1 tablet (10 mg total) by mouth every 8 (eight) hours as needed for breakthrough pain. Qty: 15 tablet, Refills: 0      CONTINUE these medications which have NOT CHANGED   Details  dexamethasone (DECADRON) 4 MG tablet Take 1 tablet (4 mg total) by mouth 2 (two) times daily. Qty: 20 tablet, Refills: 0   Associated Diagnoses: Cancer, metastatic to bone ([place]); Primary cancer of left upper lobe of lung (HCC)    fentaNYL (DURAGESIC - DOSED MCG/HR) 100 MCG/HR Place 1 patch (100 mcg total) onto the skin every 3 (three) days. Qty: 5 patch, Refills: 0   Associated Diagnoses: Cancer, metastatic to bone ([place]); Primary cancer of left upper lobe of lung (HCC)    furosemide (LASIX)  80 MG tablet Take 1 tablet by mouth daily.    lactulose (CHRONULAC) 10 GM/15ML solution Take 15 mLs by mouth daily as needed for constipation.    meloxicam (MOBIC) 15 MG tablet Take 1 tablet by mouth daily.    methadone (DOLOPHINE) 10 MG/5ML solution Take 95 mg by mouth daily.     spironolactone (ALDACTONE) 100 MG tablet Take 100 mg by mouth daily.     tamsulosin (FLOMAX) 0.4 MG CAPS capsule Take 1 capsule by mouth daily.         If you experience worsening of your admission symptoms, develop shortness of breath, life threatening emergency, suicidal or homicidal thoughts you must seek medical attention immediately by calling 911 or calling your MD immediately  if symptoms less severe.  You Must read complete instructions/literature along with all the possible adverse reactions/side effects for all the Medicines you take and that have been prescribed to you. Take any new Medicines after you have completely understood and accept all the possible adverse reactions/side effects.   Please note  You were cared for by a hospitalist during your hospital stay. If you have any questions about your discharge medications or the care you received while you were in the hospital after you are discharged, you can call the unit and asked to speak with the hospitalist on call if the hospitalist that took care of you is not available. Once you are discharged, your primary care physician will handle any further medical issues. Please note that NO REFILLS for any discharge medications will be authorized once you are discharged, as it is imperative that you return to your primary care physician (or establish a relationship with a primary care physician if you do not have one) for your aftercare needs so that they can reassess your need for medications and monitor your lab values. Today   SUBJECTIVE   Doing well  VITAL SIGNS:  Blood pressure (!) 105/52, pulse (!) 55, temperature 98.2 F (36.8 C), temperature source Oral, resp. rate 18, height '5\' 9"'$  (1.753 m), weight 79.4 kg (175 lb), SpO2 97 %.  I/O:   Intake/Output Summary (Last 24 hours) at 12/28/16 1021 Last data filed at 12/28/16 0750  Gross per 24 hour  Intake              480 ml  Output              700 ml  Net             -220 ml    PHYSICAL EXAMINATION:  GENERAL:  71 y.o.-year-old patient lying in the bed with no acute distress.  EYES: Pupils equal, round, reactive to  light and accommodation. No scleral icterus. Extraocular muscles intact.  HEENT: Head atraumatic, normocephalic. Oropharynx and nasopharynx clear.  NECK:  Supple, no jugular venous distention. No thyroid enlargement, no tenderness.  LUNGS: Normal breath sounds bilaterally, no wheezing, rales,rhonchi or crepitation. No use of accessory muscles of respiration.  CARDIOVASCULAR: S1, S2 normal. No murmurs, rubs, or gallops.  ABDOMEN: Soft, non-tender, non-distended. Bowel sounds present. No organomegaly or mass.  EXTREMITIES: No pedal edema, cyanosis, or clubbing.  NEUROLOGIC: Cranial nerves II through XII are intact. Muscle strength 5/5 in all extremities. Sensation intact. Gait not checked.  PSYCHIATRIC: The patient is alert and oriented x 3.  SKIN: No obvious rash, lesion, or ulcer.   DATA REVIEW:   CBC   Recent Labs Lab 12/27/16 0548  WBC 5.6  HGB 9.9*  HCT 28.7*  PLT 75*    Chemistries   Recent Labs Lab 12/22/16 0948 12/25/16 1648  12/27/16 0548  NA 134* 133*  < > 133*  K 4.1 3.9  < > 4.5  CL 102 99*  < > 106  CO2 24 25  < > 23  GLUCOSE 80 97  < > 168*  BUN 42* 29*  < > 37*  CREATININE 1.55* 1.35*  < > 1.26*  CALCIUM 8.7* 9.0  < > 7.8*  MG 2.2  --   --   --   AST 83* 93*  --   --   ALT 47 55  --   --   ALKPHOS 84 92  --   --   BILITOT 1.6* 2.0*  --   --   < > = values in this interval not displayed.  Microbiology Results   Recent Results (from the past 240 hour(s))  Surgical PCR screen     Status: None   Collection Time: 12/25/16  8:15 PM  Result Value Ref Range Status   MRSA, PCR NEGATIVE NEGATIVE Final   Staphylococcus aureus NEGATIVE NEGATIVE Final    Comment:        The Xpert SA Assay (FDA approved for NASAL specimens in patients over 70 years of age), is one component of a comprehensive surveillance program.  Test performance has been validated by [facility] for patients greater than or equal to 37 year old. It is not intended to diagnose  infection nor to guide or monitor treatment.     RADIOLOGY:  Dg Hip Operative Unilat W Or W/o Pelvis Left  Result Date: 12/26/2016 CLINICAL DATA:  Left hip surgery EXAM: OPERATIVE LEFT HIP (WITH PELVIS IF PERFORMED) 1 VIEWS TECHNIQUE: Fluoroscopic spot image(s) were submitted for interpretation post-operatively. COMPARISON:  None. FINDINGS: Left hip arthroplasty without failure complication. No fracture or dislocation. IMPRESSION: Left hip arthroplasty. Electronically Signed   By: Kathreen Devoid   On: 12/26/2016 14:00   Dg Hip Unilat W Or W/o Pelvis 2-3 Views Left  Result Date: 12/26/2016 CLINICAL DATA:  Less post left hip joint replacement.  Days 0. EXAM: DG HIP (WITH OR WITHOUT PELVIS) 2-3V LEFT COMPARISON:  Preoperative radiograph of December 25, 2016 FINDINGS: The patient has undergone total left hip joint prosthesis placement. Radiographic positioning of the prosthetic components is good. The interface with the native bone appears normal. Surgical skin staples are noted laterally. IMPRESSION: There is no acute postprocedure complication following left hip joint prosthesis placement. Electronically Signed   By: David  Martinique M.D.   On: 12/26/2016 13:51     Management plans discussed with the patient, family and they are in agreement.  CODE STATUS:     Code Status Orders        Start     Ordered   12/25/16 2952  Do not attempt resuscitation (DNR)  Continuous    Question Answer Comment  In the event of cardiac or respiratory ARREST Do not call a "code blue"   In the event of cardiac or respiratory ARREST Do not perform Intubation, CPR, defibrillation or ACLS   In the event of cardiac or respiratory ARREST Use medication by any route, position, wound care, and other measures to relive pain and suffering. May use oxygen, suction and manual treatment of airway obstruction as needed for comfort.      12/25/16 1748    Code Status History    Date Active Date Inactive Code Status Order ID  Comments User Context  This patient has a current code status but no historical code status.    Advance Directive Documentation     Most Recent Value  Type of Advance Directive  Living will  Pre-existing out of facility DNR order (yellow form or pink MOST form)  -  "MOST" Form in Place?  -      TOTAL TIME TAKING CARE OF THIS PATIENT: **40 minutes.    Landry Lookingbill M.D on 12/28/2016 at 10:21 AM  Between 7am to 6pm - Pager - [phone] After 6pm go to [email or web address] - password [email or web address]  Office  [phone]  CC: Primary care physician; BABAOFF, Caryl Bis, MD

## 2016-12-28 NOTE — Progress Notes (Signed)
   Subjective: 2 Days Post-Op Procedure(s) (LRB): HIP HEMIARTHROPLASTY POSSIBLE TOTAL HIP (Left) Patient reports pain as 1 on 0-10 scale.   Patient is well, and has had no acute complaints or problems Denies any CP, SOB, ABD pain. We will continue therapy today.    Objective: Vital signs in last 24 hours: Temp:  [97.5 F (36.4 C)-98.2 F (36.8 C)] 98.2 F (36.8 C) (04/19 0752) Pulse Rate:  [55-71] 55 (04/19 0752) Resp:  [18] 18 (04/19 0752) BP: (99-105)/(52-59) 105/52 (04/19 0752) SpO2:  [97 %-100 %] 97 % (04/19 0752)  Intake/Output from previous day: 04/18 0701 - 04/19 0700 In: 720 [P.O.:720] Out: 350 [Urine:350] Intake/Output this shift: No intake/output data recorded.   Recent Labs  12/25/16 1648 12/26/16 0446 12/27/16 0548  HGB 12.9* 11.1* 9.9*    Recent Labs  12/26/16 0446 12/27/16 0548  WBC 3.1* 5.6  RBC 3.41* 3.01*  HCT 32.6* 28.7*  PLT 64* 75*    Recent Labs  12/26/16 0446 12/27/16 0548  NA 135 133*  K 4.8 4.5  CL 105 106  CO2 24 23  BUN 29* 37*  CREATININE 1.15 1.26*  GLUCOSE 122* 168*  CALCIUM 8.2* 7.8*    Recent Labs  12/25/16 1648  INR 1.12    EXAM General - Patient is Alert, Appropriate and Oriented Extremity - Neurovascular intact Sensation intact distally Intact pulses distally Dorsiflexion/Plantar flexion intact No cellulitis present Compartment soft Dressing - dressing C/D/I, scant drainage and dressing changed Motor Function - intact, moving foot and toes well on exam.   Past Medical History:  Diagnosis Date  . Benign non-nodular prostatic hyperplasia with lower urinary tract symptoms   . Bladder cancer ([place])   . Chronic hepatitis C without hepatic coma ([place])   . Cirrhosis of liver ([place])   . PUD (peptic ulcer disease)   . Rotator cuff tendinitis, right   . Spondylosis of cervical region without myelopathy or radiculopathy   . Tobacco abuse     Assessment/Plan:   2 Days Post-Op Procedure(s) (LRB): HIP  HEMIARTHROPLASTY POSSIBLE TOTAL HIP (Left) Active Problems:   Hip fracture (H[facility])   Acute post op blood loss anemia with underlying chronic anemia  Estimated body mass index is 25.84 kg/m as calculated from the following:   Height as of this encounter: '5\' 9"'$  (1.753 m).   Weight as of this encounter: 79.4 kg (175 lb). Advance diet Up with therapy  Needs BM Dressing change today. Discharge home today pending BM Follow up with [facility] ortho for staple removal and steri strip application on 12/17/25 Lovenox 40 mg SQ daily x 14 days Change dressing prn, keep clean and dry. No showering until after staple removal.  DVT Prophylaxis - Lovenox, Foot Pumps and TED hose Weight-Bearing as tolerated to left leg   T. Rachelle Hora, PA-C [place] 12/28/2016, 8:22 AM

## 2016-12-29 ENCOUNTER — Other Ambulatory Visit: Payer: Self-pay | Admitting: Internal Medicine

## 2016-12-29 ENCOUNTER — Telehealth: Payer: Self-pay | Admitting: *Deleted

## 2016-12-29 DIAGNOSIS — C3412 Malignant neoplasm of upper lobe, left bronchus or lung: Secondary | ICD-10-CM

## 2016-12-29 DIAGNOSIS — C7951 Secondary malignant neoplasm of bone: Secondary | ICD-10-CM

## 2016-12-29 LAB — SURGICAL PATHOLOGY

## 2016-12-29 MED ORDER — FENTANYL 100 MCG/HR TD PT72
100.0000 ug | MEDICATED_PATCH | TRANSDERMAL | 0 refills | Status: DC
Start: 2016-12-29 — End: 2017-01-11

## 2016-12-29 NOTE — Telephone Encounter (Signed)
Pt's daughter, Threasa Beards, called to notify our office that the fentanyl prescription will require a prior authorization. Pt is due to change current patch on Saturday and does not have anymore patches at home. Also, pt's daughter stated that the order for the wheelchair needs clarification and our office should expect a fax from [place].   [facility] contacted and prior auth for fentanyl has been initiated. Rep informed me that could take up to 24 hours for determination. Reached out to [employer] to see if charitable funds could cover this first box of patches until they can be approved through insurance. Awaiting reply back.   Renita Papa, RN will take care of clarifying prescription for the pt's wheelchair. Fax has been received.

## 2016-12-29 NOTE — Telephone Encounter (Signed)
Per patient's daughter, fentanyl patch has been approved and can be dispensed at [facility]. Informed pt's daughter that will get order for wheelchair clarified and that [facility] will call once order has been processed.

## 2017-01-01 ENCOUNTER — Inpatient Hospital Stay: Payer: Medicare Other

## 2017-01-01 ENCOUNTER — Inpatient Hospital Stay (HOSPITAL_BASED_OUTPATIENT_CLINIC_OR_DEPARTMENT_OTHER): Payer: Medicare Other | Admitting: Internal Medicine

## 2017-01-01 ENCOUNTER — Ambulatory Visit: Payer: Medicare Other

## 2017-01-01 VITALS — BP 113/56 | HR 64 | Temp 97.4°F | Resp 16 | Wt 175.4 lb

## 2017-01-01 DIAGNOSIS — G893 Neoplasm related pain (acute) (chronic): Secondary | ICD-10-CM

## 2017-01-01 DIAGNOSIS — Z79899 Other long term (current) drug therapy: Secondary | ICD-10-CM | POA: Diagnosis not present

## 2017-01-01 DIAGNOSIS — Z8711 Personal history of peptic ulcer disease: Secondary | ICD-10-CM | POA: Diagnosis not present

## 2017-01-01 DIAGNOSIS — C3412 Malignant neoplasm of upper lobe, left bronchus or lung: Secondary | ICD-10-CM | POA: Diagnosis present

## 2017-01-01 DIAGNOSIS — C7951 Secondary malignant neoplasm of bone: Secondary | ICD-10-CM | POA: Diagnosis not present

## 2017-01-01 DIAGNOSIS — B182 Chronic viral hepatitis C: Secondary | ICD-10-CM | POA: Diagnosis not present

## 2017-01-01 DIAGNOSIS — Z87891 Personal history of nicotine dependence: Secondary | ICD-10-CM

## 2017-01-01 DIAGNOSIS — K7460 Unspecified cirrhosis of liver: Secondary | ICD-10-CM

## 2017-01-01 DIAGNOSIS — R161 Splenomegaly, not elsewhere classified: Secondary | ICD-10-CM

## 2017-01-01 DIAGNOSIS — Z8551 Personal history of malignant neoplasm of bladder: Secondary | ICD-10-CM

## 2017-01-01 DIAGNOSIS — R269 Unspecified abnormalities of gait and mobility: Secondary | ICD-10-CM | POA: Diagnosis not present

## 2017-01-01 DIAGNOSIS — N401 Enlarged prostate with lower urinary tract symptoms: Secondary | ICD-10-CM | POA: Diagnosis not present

## 2017-01-01 MED ORDER — DENOSUMAB 120 MG/1.7ML ~~LOC~~ SOLN
120.0000 mg | Freq: Once | SUBCUTANEOUS | Status: AC
  Administered 2017-01-01: 120 mg via SUBCUTANEOUS
  Filled 2017-01-01: qty 1.7

## 2017-01-01 NOTE — Progress Notes (Signed)
Patient here today for follow up.  Patient states that he is currently wearing 2 - 166mg fentanyl patches.

## 2017-01-01 NOTE — Progress Notes (Signed)
Confirmed with Dr. Rogue Bussing that patient is to receive the [facility] today with calcium 7.8 resulted on 12/27/2016

## 2017-01-01 NOTE — Assessment & Plan Note (Addendum)
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 [place] ca- TTF-1 were negative [left-sided biopsy]; however clinically suggestive of metastatic disease from lung primary. Discussed with pathology. We'll check for molecular markers- PDL 1.   # Long discussion the patient and daughter regarding the pathology/stage of the disease. Also discussed that the goal of treatment would be palliative/not curative.  # Discussed multiple options including chemotherapy; immunotherapy and targeted therapy [if positive for any targets.]. Given patient's thrombocytopenia/cirrhosis- he understands that chemotherapy would be challenging.   # Recommend carboplatin [dose reduced/omitted sec to thrombocytopenia] and Alimta-Avastin. Avastin will be held cycle #1 because of his recent hip surgery. Discussed M46 injections/folic acid.  Discussed the potential side effects including but not limited to-increasing fatigue, nausea vomiting, diarrhea, hair loss, sores in the mouth, increase risk of infection and also neuropathy.   Reviewed the rationale for using Avastin. Discussed the potential side effects including but not limited to elevated blood pressure ; nephrotic syndrome wound healing problems.  # Pain control-secondary to bony metastasis. Recommend fentanyl patch 100 g; and use oxycodone 10 mg every 6-8 hours as needed. I again recommended the patient not to increase his pain medication/especially his long-acting medication- given the concerns for respiratory depression etc. He will continued to be weaned off methadone.  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 We'll proceed with X-geva today; recommend continued calcium and vitamin D. Patient has been evaluated by radiation oncology; we discussed regarding timing of radiation along with the chemotherapy.  #History of cirrhosis and splenomegaly-seems to be compensated.    # carbo-alimta/avastin/ chemo ed week/ b12 injjectoion./cbc/cmp.   # Patient  follow-up in approximately 10 days- to proceed with chemotherapy/labs.

## 2017-01-01 NOTE — Progress Notes (Signed)
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  male long-standing history of smoking/hep C cirrhosis- with multiple bone lesions and lung mass is here to review the results of the biopsy/ plan of care.  In the interim patient was admitted to the hospital for a left pathologic fracture; he underwent left hip arthroplasty. Postoperative course was uneventful.  Patient continues to have pain issues; patient has been recommended fentanyl patch 100 g. Instead of taking breakthrough pain medication of oxycodone patient added a extra fentanyl patch of 100 micrograms. Still complains to control with pain. Patient also concerned about his methadone being weaned off. He otherwise denies any worsening shortness of breath or hemoptysis. Chronic mild cough. No bleeding.  ROS: A complete 10 point review of system is done which is negative except mentioned above in history of present illness  MEDICAL HISTORY:  Past Medical History:  Diagnosis Date  . Benign non-nodular prostatic hyperplasia with lower urinary tract symptoms   . Bladder cancer ([place])   . Chronic hepatitis C without hepatic coma ([place])   . Cirrhosis of liver ([place])   . PUD (peptic ulcer disease)   . Rotator cuff tendinitis, right   .  Spondylosis of cervical region without myelopathy or radiculopathy   . Tobacco abuse     SURGICAL HISTORY: Past Surgical History:  Procedure Laterality Date  . ABLATION LIVER TUMORS W/CRYOSURGICAL  2012  . CYSTOTOMY W/EXCISION BLADDER TUMOR  2012  . HERNIA REPAIR  1996  . REPAIR PERONEAL TENDONS ANKLE  1976  . TOTAL HIP ARTHROPLASTY Left 12/26/2016   Procedure: HIP HEMIARTHROPLASTY POSSIBLE TOTAL HIP;  Surgeon: Hessie Knows, MD;  Location: [facility] ORS;  Service: Orthopedics;  Laterality: Left;    SOCIAL HISTORY: Quit smoking- 40ppd; no alcohhol now. [place] Social History   Social History  . Marital status: Widowed    Spouse name: N/A  . Number of children: N/A  . Years of education: N/A   Occupational History  . Not on file.   Social History Main Topics  . Smoking status: Former Smoker    Packs/day: 1.00    Years: 49.00    Types: Cigarettes  . Smokeless tobacco: Never Used  . Alcohol use No  . Drug use: No  . Sexual activity: Not on file   Other Topics Concern  . Not on file   Social History Narrative  . No narrative on file    FAMILY HISTORY: Family History  Problem Relation Age of Onset  . Lung cancer Father   . Diabetes type II Maternal Grandmother   . Lung cancer Mother   . Mental illness Brother   . Other Brother     h/o substance abuse    ALLERGIES:  is allergic to iodine.  MEDICATIONS:  Current Outpatient Prescriptions  Medication Sig Dispense Refill  . calcium-vitamin D (OSCAL WITH D) 500-200 MG-UNIT tablet Take 1 tablet by mouth 2 (two) times daily. 30 tablet 0  . dexamethasone (DECADRON) 4 MG tablet Take 1 tablet (4 mg total) by mouth 2 (two) times daily. 20 tablet 0  . diphenhydrAMINE (BENADRYL) 50 MG tablet Take 50 mg by mouth daily as needed for itching.    . enoxaparin (LOVENOX) 40 MG/0.4ML injection Inject 0.4 mLs (40 mg total) into the skin daily. 14 Syringe 0  . fentaNYL (DURAGESIC - DOSED MCG/HR) 100 MCG/HR Place 1 patch  (100 mcg total) onto the skin every 3 (three) days. 5 patch 0  . furosemide (LASIX) 80 MG tablet Take 1 tablet by mouth daily.    Marland Kitchen lactulose (CHRONULAC) 10 GM/15ML solution Take 15 mLs by mouth daily as needed for constipation.    . meloxicam (MOBIC) 15 MG tablet Take 1 tablet by mouth daily.    . methadone (DOLOPHINE) 10 MG/5ML solution Take 95 mg by mouth daily.     Marland Kitchen oxyCODONE 10 MG TABS Take 1 tablet (10 mg total) by mouth every 8 (eight) hours as needed for breakthrough pain. 15 tablet 0  . spironolactone (ALDACTONE) 100 MG tablet Take 100 mg by mouth daily.     . tamsulosin (FLOMAX) 0.4 MG CAPS capsule Take 1 capsule by mouth daily.     No current facility-administered medications for this visit.       Marland Kitchen  PHYSICAL EXAMINATION: ECOG PERFORMANCE STATUS: 1 - Symptomatic but completely ambulatory  Vitals:   01/01/17 1347  BP: (!) 113/56  Pulse: 64  Resp: 16  Temp: 97.4 F (36.3 C)   Filed Weights   01/01/17 1347  Weight: 175 lb 6 oz (79.5 kg)    GENERAL: Well-nourished well-developed; Alert, no distress and comfortable.   Accompanied by his daughter. He is in a wheelchair. EYES: no pallor or icterus OROPHARYNX: no thrush or ulceration; good dentition  NECK: supple, no masses felt LYMPH:  no palpable lymphadenopathy in the cervical, axillary or inguinal regions LUNGS: clear to auscultation and  No wheeze or crackles HEART/CVS: regular rate & rhythm and no murmurs; positive for bilateral lower extremity edema ABDOMEN: abdomen soft, non-tender and normal bowel sounds; splenomegaly cannot be appreciated. Musculoskeletal:no cyanosis of digits and no clubbing  PSYCH: alert & oriented x 3 with fluent speech NEURO: no focal motor/sensory deficits SKIN:  Positive for spider angioma upper trunk. Chronic venous stasis changes noted bilateral lower extremities.  LABORATORY DATA:  I have reviewed the data as listed Lab Results  Component Value Date   WBC 5.6 12/27/2016   HGB  9.9 (L) 12/27/2016   HCT 28.7 (L) 12/27/2016   MCV 95.5 12/27/2016   PLT 75 (L) 12/27/2016    Recent Labs  12/22/16 0948 12/25/16 1648 12/26/16 0446 12/27/16 0548  NA 134* 133* 135 133*  K 4.1 3.9 4.8 4.5  CL 102 99* 105 106  CO2 '24 25 24 23  '$ GLUCOSE 80 97 122* 168*  BUN 42* 29* 29* 37*  CREATININE 1.55* 1.35* 1.15 1.26*  CALCIUM 8.7* 9.0 8.2* 7.8*  GFRNONAA 44* 52* >60 57*  GFRAA 51* >60 >60 >60  PROT 6.7 7.0  --   --   ALBUMIN 3.3* 3.3*  --   --   AST 83* 93*  --   --   ALT 47 55  --   --   ALKPHOS 84 92  --   --  BILITOT 1.6* 2.0*  --   --   BILIDIR  --  0.7*  --   --   IBILI  --  1.3*  --   --     RADIOGRAPHIC STUDIES: I have personally reviewed the radiological images as listed and agreed with the findings in the report. Mr Jeri Cos Wo Contrast  Result Date: 12/22/2016 CLINICAL DATA:  Recently diagnosed lung cancer.  Gait instability. EXAM: MRI HEAD WITHOUT AND WITH CONTRAST TECHNIQUE: Multiplanar, multiecho pulse sequences of the brain and surrounding structures were obtained without and with intravenous contrast. CONTRAST:  92m MULTIHANCE GADOBENATE DIMEGLUMINE 529 MG/ML IV SOLN COMPARISON:  None. FINDINGS: Brain: There is no evidence of acute infarct, intracranial hemorrhage, mass, midline shift, or extra-axial fluid collection. The ventricles and sulci are within normal limits for age. Scattered punctate foci of cerebral white matter T2 hyperintensity are nonspecific but compatible with minimal chronic small vessel ischemic disease. No abnormal enhancement is identified. Vascular: Major intracranial vascular flow voids are preserved. Skull and upper cervical spine: Prior biparietal craniectomy. Patchy regions of heterogeneously diminished bone marrow signal intensity, particularly posteriorly in the skull as well as in the upper cervical spine. No destructive osseous lesion identified. Sinuses/Orbits: Unremarkable orbits. Paranasal sinuses and mastoid air cells are  clear. Other: None. IMPRESSION: 1. No evidence of intracranial metastatic disease. 2. Heterogeneous marrow signal in the skull and upper cervical spine, nonspecific. Osseous metastatic disease not excluded. No destructive osseous lesion. Electronically Signed   By: ALogan BoresM.D.   On: 12/22/2016 13:07   Nm Pet Image Initial (pi) Skull Base To Thigh  Result Date: 12/18/2016 CLINICAL DATA:  Initial treatment strategy for lung mass. EXAM: NUCLEAR MEDICINE PET SKULL BASE TO THIGH TECHNIQUE: 12.4 mCi F-18 FDG was injected intravenously. Full-ring PET imaging was performed from the skull base to thigh after the radiotracer. CT data was obtained and used for attenuation correction and anatomic localization. FASTING BLOOD GLUCOSE:  Value: 91 mg/dl COMPARISON:  None. FINDINGS: NECK No hypermetabolic lymph nodes in the neck. CHEST Dominant mass in the LEFT upper lobe measures 4.1 cm with SUV max equal 16.5. Nodule in the LEFT upper lobe measures 2.9 cm (image 56, series 3) with SUV max equal 4.5. Hypermetabolic LEFT hilar lymph node with SUV max equal 6.0. Smaller nodule in the LEFT lower lobe measures 10 mm (image 85, series 5) without clear metabolic activity. Similar nodule in the RIGHT lower lobe measuring 7 mm (image 87, series 3) with trace metabolic activity. Nodule over the RIGHT hemidiaphragm measures 9 mm. ABDOMEN/PELVIS No abnormal hypermetabolic activity within the liver, pancreas, adrenal glands, or spleen. No hypermetabolic lymph nodes in the abdomen or pelvis. The liver has a shrunken nodular contour with enlargement of the caudate lobe. Spleen is enlarged. Small LEFT bladder diverticulum. SKELETON Hypermetabolic destructive lesion within the RIGHT pedicle of the T2 vertebral body with SUV max equal 6.4. Expansile lesion within the anterior RIGHT third rib measures 5 by 3.9 cm with intense metabolic activity. Distant skeletal metastasis within the LEFT femoral neck with SUV max equals 7.3. IMPRESSION: 1.  Intense hypermetabolic LEFT upper lobe mass consists with primary bronchogenic carcinoma. 2. Large hypermetabolic nodule in the LEFT upper lobe. Favor metastatic disease. 3. Multiple bilateral lower lobe pulmonary nodules without metabolic activity concerning for pulmonary metastasis. 4. Hypermetabolic LEFT hilar nodal metastasis. 5. Hypermetabolic expansile skeletal metastasis including the RIGHT vertebral body pedicle at T2 and an anterior RIGHT rib. More distant skeletal metastasis in the LEFT femur  neck. 6. Splenomegaly and liver cirrhosis. These results will be called to the ordering clinician or representative by the Radiologist Assistant, and communication documented in the PACS or zVision Dashboard. Electronically Signed   By: Suzy Bouchard M.D.   On: 12/18/2016 10:09   Dg Chest Portable 1 View  Result Date: 12/25/2016 CLINICAL DATA:  Preop for left hip fracture. EXAM: PORTABLE CHEST 1 VIEW COMPARISON:  Head CT 12/18/2016 FINDINGS: 2 left lung mass and right third rib metastasis with pleural disease, known from recent PET-CT. No cardiomegaly. There is no edema, consolidation, effusion, or pneumothorax. No acute fracture IMPRESSION: 1. No acute finding. 2. Known left lung masses and right third rib metastasis. Electronically Signed   By: Monte Fantasia M.D.   On: 12/25/2016 17:16   Dg Hip Operative Unilat W Or W/o Pelvis Left  Result Date: 12/26/2016 CLINICAL DATA:  Left hip surgery EXAM: OPERATIVE LEFT HIP (WITH PELVIS IF PERFORMED) 1 VIEWS TECHNIQUE: Fluoroscopic spot image(s) were submitted for interpretation post-operatively. COMPARISON:  None. FINDINGS: Left hip arthroplasty without failure complication. No fracture or dislocation. IMPRESSION: Left hip arthroplasty. Electronically Signed   By: Kathreen Devoid   On: 12/26/2016 14:00   Dg Hip Unilat W Or W/o Pelvis 2-3 Views Left  Result Date: 12/26/2016 CLINICAL DATA:  Less post left hip joint replacement.  Days 0. EXAM: DG HIP (WITH OR  WITHOUT PELVIS) 2-3V LEFT COMPARISON:  Preoperative radiograph of December 25, 2016 FINDINGS: The patient has undergone total left hip joint prosthesis placement. Radiographic positioning of the prosthetic components is good. The interface with the native bone appears normal. Surgical skin staples are noted laterally. IMPRESSION: There is no acute postprocedure complication following left hip joint prosthesis placement. Electronically Signed   By: David  Martinique M.D.   On: 12/26/2016 13:51   Dg Hip Unilat With Pelvis 2-3 Views Left  Result Date: 12/25/2016 CLINICAL DATA:  Fall 1 week ago with subsequent pain. Initial encounter. EXAM: DG HIP (WITH OR WITHOUT PELVIS) 2-3V LEFT COMPARISON:  PET-CT 12/18/2016 FINDINGS: Transcervical left femoral neck fracture, acute appearing, with 9 mm of displacement. This is pathologic by prior PET-CT. No dislocation. No visible pelvic ring fracture. IMPRESSION: Displaced left femoral neck fracture, pathologic based on PET-CT 12/18/2016 Electronically Signed   By: Monte Fantasia M.D.   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 [place] ca- TTF-1 were negative [left-sided biopsy]; however clinically suggestive of metastatic disease from lung primary. Discussed with pathology. We'll check for molecular markers- PDL 1.   # Long discussion the patient and daughter regarding the pathology/stage of the disease. Also discussed that the goal of treatment would be palliative/not curative.  # Discussed multiple options including chemotherapy; immunotherapy and targeted therapy [if positive for any targets.]. Given patient's thrombocytopenia/cirrhosis- he understands that chemotherapy would be challenging.   # Recommend carboplatin [dose reduced/omitted sec to thrombocytopenia] and Alimta-Avastin. Avastin will be held cycle #1 because of his recent hip surgery. Discussed Z61  injections/folic acid.  Discussed the potential side effects including but not limited to-increasing fatigue, nausea vomiting, diarrhea, hair loss, sores in the mouth, increase risk of infection and also neuropathy.   Reviewed the rationale for using Avastin. Discussed the potential side effects including but not limited to elevated blood pressure ; nephrotic syndrome wound healing problems.  # Pain control-secondary to bony metastasis. Recommend fentanyl patch 100 g;  and use oxycodone 10 mg every 6-8 hours as needed. I again recommended the patient not to increase his pain medication/especially his long-acting medication- given the concerns for respiratory depression etc. He will continued to be weaned off methadone.  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 We'll proceed with X-geva today; recommend continued calcium and vitamin D. Patient has been evaluated by radiation oncology; we discussed regarding timing of radiation along with the chemotherapy.  #History of cirrhosis and splenomegaly-seems to be compensated.    # carbo-alimta/avastin/ chemo ed week/ b12 injjectoion./cbc/cmp.   # Patient follow-up in approximately 10 days- to proceed with chemotherapy/labs.  All questions were answered. The patient knows to call the clinic with any problems, questions or concerns.     Cammie Sickle, MD 01/02/2017 1:42 PM

## 2017-01-01 NOTE — Progress Notes (Signed)
Calcium =7.8, Albumin =3.3, corrected calcium =8.36

## 2017-01-02 NOTE — Progress Notes (Signed)
START ON PATHWAY REGIMEN - Non-Small Cell Lung     A cycle is every 21 days:     Paclitaxel      Carboplatin      Bevacizumab   **Always confirm dose/schedule in your pharmacy ordering system**    Patient Characteristics: Stage IV Metastatic, Non Squamous, Initial Chemotherapy/Immunotherapy, PS = 0, 1, PD-L1 Expression Positive 1-49% (TPS) / Negative / Not Tested / Not a Candidate for Immunotherapy AJCC T Category: T3 Current Disease Status: Distant Metastases AJCC N Category: N1 AJCC M Category: M1c AJCC 8 Stage Grouping: IVB Histology: Non Squamous Cell ROS1 Rearrangement Status: Awaiting Test Results T790M Mutation Status: Not Applicable - EGFR Mutation Negative/Unknown Other Mutations/Biomarkers: No Other Actionable Mutations PD-L1 Expression Status: Quantity Not Sufficient Chemotherapy/Immunotherapy LOT: Initial Chemotherapy/Immunotherapy Molecular Targeted Therapy: Not Appropriate ALK Translocation Status: Awaiting Test Results Would you be surprised if this patient died  in the next year? I would be surprised if this patient died in the next year EGFR Mutation Status: Awaiting Test Results BRAF V600E Mutation Status: Awaiting Test Results Performance Status: PS = 0, 1  Intent of Therapy: Non-Curative / Palliative Intent, Discussed with Patient

## 2017-01-08 NOTE — Patient Instructions (Signed)
Bevacizumab injection What is this medicine? BEVACIZUMAB (be va SIZ yoo mab) is a monoclonal antibody. It is used to treat many types of cancer. This medicine may be used for other purposes; ask your health care provider or pharmacist if you have questions. COMMON BRAND NAME(S): Avastin What should I tell my health care provider before I take this medicine? They need to know if you have any of these conditions: -diabetes -heart disease -high blood pressure -history of coughing up blood -prior anthracycline chemotherapy (e.g., doxorubicin, daunorubicin, epirubicin) -recent or ongoing radiation therapy -recent or planning to have surgery -stroke -an unusual or allergic reaction to bevacizumab, hamster proteins, mouse proteins, other medicines, foods, dyes, or preservatives -pregnant or trying to get pregnant -breast-feeding How should I use this medicine? This medicine is for infusion into a vein. It is given by a health care professional in a hospital or clinic setting. Talk to your pediatrician regarding the use of this medicine in children. Special care may be needed. Overdosage: If you think you have taken too much of this medicine contact a poison control center or emergency room at once. NOTE: This medicine is only for you. Do not share this medicine with others. What if I miss a dose? It is important not to miss your dose. Call your doctor or health care professional if you are unable to keep an appointment. What may interact with this medicine? Interactions are not expected. This list may not describe all possible interactions. Give your health care provider a list of all the medicines, herbs, non-prescription drugs, or dietary supplements you use. Also tell them if you smoke, drink alcohol, or use illegal drugs. Some items may interact with your medicine. What should I watch for while using this medicine? Your condition will be monitored carefully while you are receiving this  medicine. You will need important blood work and urine testing done while you are taking this medicine. This medicine may increase your risk to bruise or bleed. Call your doctor or health care professional if you notice any unusual bleeding. This medicine should be started at least 28 days following major surgery and the site of the surgery should be totally healed. Check with your doctor before scheduling dental work or surgery while you are receiving this treatment. Talk to your doctor if you have recently had surgery or if you have a wound that has not healed. Do not become pregnant while taking this medicine or for 6 months after stopping it. Women should inform their doctor if they wish to become pregnant or think they might be pregnant. There is a potential for serious side effects to an unborn child. Talk to your health care professional or pharmacist for more information. Do not breast-feed an infant while taking this medicine and for 6 months after the last dose. This medicine has caused ovarian failure in some women. This medicine may interfere with the ability to have a child. You should talk to your doctor or health care professional if you are concerned about your fertility. What side effects may I notice from receiving this medicine? Side effects that you should report to your doctor or health care professional as soon as possible: -allergic reactions like skin rash, itching or hives, swelling of the face, lips, or tongue -chest pain or chest tightness -chills -coughing up blood -high fever -seizures -severe constipation -signs and symptoms of bleeding such as bloody or black, tarry stools; red or dark-brown urine; spitting up blood or brown material that looks   like coffee grounds; red spots on the skin; unusual bruising or bleeding from the eye, gums, or nose -signs and symptoms of a blood clot such as breathing problems; chest pain; severe, sudden headache; pain, swelling, warmth in  the leg -signs and symptoms of a stroke like changes in vision; confusion; trouble speaking or understanding; severe headaches; sudden numbness or weakness of the face, arm or leg; trouble walking; dizziness; loss of balance or coordination -stomach pain -sweating -swelling of legs or ankles -vomiting -weight gain Side effects that usually do not require medical attention (report to your doctor or health care professional if they continue or are bothersome): -back pain -changes in taste -decreased appetite -dry skin -nausea -tiredness This list may not describe all possible side effects. Call your doctor for medical advice about side effects. You may report side effects to FDA at 1-800-FDA-1088. Where should I keep my medicine? This drug is given in a hospital or clinic and will not be stored at home. NOTE: This sheet is a summary. It may not cover all possible information. If you have questions about this medicine, talk to your doctor, pharmacist, or health care provider.  2018 [employer] Standard (2016-08-25 14:33:29) Pemetrexed injection What is this medicine? PEMETREXED (PEM e TREX ed) is a chemotherapy drug used to treat lung cancers like non-small cell lung cancer and mesothelioma. It may also be used to treat other cancers. This medicine may be used for other purposes; ask your health care provider or pharmacist if you have questions. COMMON BRAND NAME(S): Alimta What should I tell my health care provider before I take this medicine? They need to know if you have any of these conditions: -infection (especially a virus infection such as chickenpox, cold sores, or herpes) -kidney disease -low blood counts, like low white cell, platelet, or red cell counts -lung or breathing disease, like asthma -radiation therapy -an unusual or allergic reaction to pemetrexed, other medicines, foods, dyes, or preservative -pregnant or trying to get pregnant -breast-feeding How should I use  this medicine? This drug is given as an infusion into a vein. It is administered in a hospital or clinic by a specially trained health care professional. Talk to your pediatrician regarding the use of this medicine in children. Special care may be needed. Overdosage: If you think you have taken too much of this medicine contact a poison control center or emergency room at once. NOTE: This medicine is only for you. Do not share this medicine with others. What if I miss a dose? It is important not to miss your dose. Call your doctor or health care professional if you are unable to keep an appointment. What may interact with this medicine? This medicine may interact with the following medications: -Ibuprofen This list may not describe all possible interactions. Give your health care provider a list of all the medicines, herbs, non-prescription drugs, or dietary supplements you use. Also tell them if you smoke, drink alcohol, or use illegal drugs. Some items may interact with your medicine. What should I watch for while using this medicine? Visit your doctor for checks on your progress. This drug may make you feel generally unwell. This is not uncommon, as chemotherapy can affect healthy cells as well as cancer cells. Report any side effects. Continue your course of treatment even though you feel ill unless your doctor tells you to stop. In some cases, you may be given additional medicines to help with side effects. Follow all directions for their use. Call your  doctor or health care professional for advice if you get a fever, chills or sore throat, or other symptoms of a cold or flu. Do not treat yourself. This drug decreases your body's ability to fight infections. Try to avoid being around people who are sick. This medicine may increase your risk to bruise or bleed. Call your doctor or health care professional if you notice any unusual bleeding. Be careful brushing and flossing your teeth or using a  toothpick because you may get an infection or bleed more easily. If you have any dental work done, tell your dentist you are receiving this medicine. Avoid taking products that contain aspirin, acetaminophen, ibuprofen, naproxen, or ketoprofen unless instructed by your doctor. These medicines may hide a fever. Call your doctor or health care professional if you get diarrhea or mouth sores. Do not treat yourself. To protect your kidneys, drink water or other fluids as directed while you are taking this medicine. Do not become pregnant while taking this medicine or for 6 months after stopping it. Women should inform their doctor if they wish to become pregnant or think they might be pregnant. Men should not father a child while taking this medicine and for 3 months after stopping it. This may interfere with the ability to father a child. You should talk to your doctor or health care professional if you are concerned about your fertility. There is a potential for serious side effects to an unborn child. Talk to your health care professional or pharmacist for more information. Do not breast-feed an infant while taking this medicine or for 1 week after stopping it. What side effects may I notice from receiving this medicine? Side effects that you should report to your doctor or health care professional as soon as possible: -allergic reactions like skin rash, itching or hives, swelling of the face, lips, or tongue -breathing problems -redness, blistering, peeling or loosening of the skin, including inside the mouth -signs and symptoms of bleeding such as bloody or black, tarry stools; red or dark-brown urine; spitting up blood or brown material that looks like coffee grounds; red spots on the skin; unusual bruising or bleeding from the eye, gums, or nose -signs and symptoms of infection like fever or chills; cough; sore throat; pain or trouble passing urine -signs and symptoms of kidney injury like trouble  passing urine or change in the amount of urine -signs and symptoms of liver injury like dark yellow or brown urine; general ill feeling or flu-like symptoms; light-colored stools; loss of appetite; nausea; right upper belly pain; unusually weak or tired; yellowing of the eyes or skin Side effects that usually do not require medical attention (report to your doctor or health care professional if they continue or are bothersome): -constipation -dizziness -mouth sores -nausea, vomiting -pain, tingling, numbness in the hands or feet -unusually weak or tired This list may not describe all possible side effects. Call your doctor for medical advice about side effects. You may report side effects to FDA at 1-800-FDA-1088. Where should I keep my medicine? This drug is given in a hospital or clinic and will not be stored at home. NOTE: This sheet is a summary. It may not cover all possible information. If you have questions about this medicine, talk to your doctor, pharmacist, or health care provider.  2018 [employer] Standard (2016-06-27 18:51:46) Carboplatin injection What is this medicine? CARBOPLATIN (KAR boe pla tin) is a chemotherapy drug. It targets fast dividing cells, like cancer cells, and causes these   cells to die. This medicine is used to treat ovarian cancer and many other cancers. This medicine may be used for other purposes; ask your health care provider or pharmacist if you have questions. COMMON BRAND NAME(S): Paraplatin What should I tell my health care provider before I take this medicine? They need to know if you have any of these conditions: -blood disorders -hearing problems -kidney disease -recent or ongoing radiation therapy -an unusual or allergic reaction to carboplatin, cisplatin, other chemotherapy, other medicines, foods, dyes, or preservatives -pregnant or trying to get pregnant -breast-feeding How should I use this medicine? This drug is usually given as an  infusion into a vein. It is administered in a hospital or clinic by a specially trained health care professional. Talk to your pediatrician regarding the use of this medicine in children. Special care may be needed. Overdosage: If you think you have taken too much of this medicine contact a poison control center or emergency room at once. NOTE: This medicine is only for you. Do not share this medicine with others. What if I miss a dose? It is important not to miss a dose. Call your doctor or health care professional if you are unable to keep an appointment. What may interact with this medicine? -medicines for seizures -medicines to increase blood counts like filgrastim, pegfilgrastim, sargramostim -some antibiotics like amikacin, gentamicin, neomycin, streptomycin, tobramycin -vaccines Talk to your doctor or health care professional before taking any of these medicines: -acetaminophen -aspirin -ibuprofen -ketoprofen -naproxen This list may not describe all possible interactions. Give your health care provider a list of all the medicines, herbs, non-prescription drugs, or dietary supplements you use. Also tell them if you smoke, drink alcohol, or use illegal drugs. Some items may interact with your medicine. What should I watch for while using this medicine? Your condition will be monitored carefully while you are receiving this medicine. You will need important blood work done while you are taking this medicine. This drug may make you feel generally unwell. This is not uncommon, as chemotherapy can affect healthy cells as well as cancer cells. Report any side effects. Continue your course of treatment even though you feel ill unless your doctor tells you to stop. In some cases, you may be given additional medicines to help with side effects. Follow all directions for their use. Call your doctor or health care professional for advice if you get a fever, chills or sore throat, or other symptoms  of a cold or flu. Do not treat yourself. This drug decreases your body's ability to fight infections. Try to avoid being around people who are sick. This medicine may increase your risk to bruise or bleed. Call your doctor or health care professional if you notice any unusual bleeding. Be careful brushing and flossing your teeth or using a toothpick because you may get an infection or bleed more easily. If you have any dental work done, tell your dentist you are receiving this medicine. Avoid taking products that contain aspirin, acetaminophen, ibuprofen, naproxen, or ketoprofen unless instructed by your doctor. These medicines may hide a fever. Do not become pregnant while taking this medicine. Women should inform their doctor if they wish to become pregnant or think they might be pregnant. There is a potential for serious side effects to an unborn child. Talk to your health care professional or pharmacist for more information. Do not breast-feed an infant while taking this medicine. What side effects may I notice from receiving this medicine? Side effects   that you should report to your doctor or health care professional as soon as possible: -allergic reactions like skin rash, itching or hives, swelling of the face, lips, or tongue -signs of infection - fever or chills, cough, sore throat, pain or difficulty passing urine -signs of decreased platelets or bleeding - bruising, pinpoint red spots on the skin, black, tarry stools, nosebleeds -signs of decreased red blood cells - unusually weak or tired, fainting spells, lightheadedness -breathing problems -changes in hearing -changes in vision -chest pain -high blood pressure -low blood counts - This drug may decrease the number of white blood cells, red blood cells and platelets. You may be at increased risk for infections and bleeding. -nausea and vomiting -pain, swelling, redness or irritation at the injection site -pain, tingling, numbness in the  hands or feet -problems with balance, talking, walking -trouble passing urine or change in the amount of urine Side effects that usually do not require medical attention (report to your doctor or health care professional if they continue or are bothersome): -hair loss -loss of appetite -metallic taste in the mouth or changes in taste This list may not describe all possible side effects. Call your doctor for medical advice about side effects. You may report side effects to FDA at 1-800-FDA-1088. Where should I keep my medicine? This drug is given in a hospital or clinic and will not be stored at home. NOTE: This sheet is a summary. It may not cover all possible information. If you have questions about this medicine, talk to your doctor, pharmacist, or health care provider.  2018 [employer]/Gold Standard (2007-12-03 14:38:05)  

## 2017-01-09 ENCOUNTER — Inpatient Hospital Stay: Payer: Medicare Other | Attending: Internal Medicine

## 2017-01-09 DIAGNOSIS — B182 Chronic viral hepatitis C: Secondary | ICD-10-CM | POA: Diagnosis not present

## 2017-01-09 DIAGNOSIS — D696 Thrombocytopenia, unspecified: Secondary | ICD-10-CM | POA: Insufficient documentation

## 2017-01-09 DIAGNOSIS — Z96642 Presence of left artificial hip joint: Secondary | ICD-10-CM | POA: Diagnosis not present

## 2017-01-09 DIAGNOSIS — Z8711 Personal history of peptic ulcer disease: Secondary | ICD-10-CM | POA: Insufficient documentation

## 2017-01-09 DIAGNOSIS — C7802 Secondary malignant neoplasm of left lung: Secondary | ICD-10-CM | POA: Diagnosis not present

## 2017-01-09 DIAGNOSIS — C7951 Secondary malignant neoplasm of bone: Secondary | ICD-10-CM | POA: Diagnosis not present

## 2017-01-09 DIAGNOSIS — K7460 Unspecified cirrhosis of liver: Secondary | ICD-10-CM | POA: Insufficient documentation

## 2017-01-09 DIAGNOSIS — C3412 Malignant neoplasm of upper lobe, left bronchus or lung: Secondary | ICD-10-CM | POA: Diagnosis present

## 2017-01-09 DIAGNOSIS — Z5111 Encounter for antineoplastic chemotherapy: Secondary | ICD-10-CM | POA: Diagnosis not present

## 2017-01-09 DIAGNOSIS — Z87891 Personal history of nicotine dependence: Secondary | ICD-10-CM | POA: Diagnosis not present

## 2017-01-09 DIAGNOSIS — Z79899 Other long term (current) drug therapy: Secondary | ICD-10-CM | POA: Insufficient documentation

## 2017-01-09 DIAGNOSIS — R161 Splenomegaly, not elsewhere classified: Secondary | ICD-10-CM | POA: Diagnosis not present

## 2017-01-11 ENCOUNTER — Inpatient Hospital Stay (HOSPITAL_BASED_OUTPATIENT_CLINIC_OR_DEPARTMENT_OTHER): Payer: Medicare Other | Admitting: Internal Medicine

## 2017-01-11 ENCOUNTER — Other Ambulatory Visit: Payer: Self-pay | Admitting: Internal Medicine

## 2017-01-11 ENCOUNTER — Inpatient Hospital Stay: Payer: Medicare Other

## 2017-01-11 VITALS — BP 105/54 | HR 83 | Temp 99.0°F | Wt 164.2 lb

## 2017-01-11 DIAGNOSIS — K7460 Unspecified cirrhosis of liver: Secondary | ICD-10-CM

## 2017-01-11 DIAGNOSIS — B182 Chronic viral hepatitis C: Secondary | ICD-10-CM

## 2017-01-11 DIAGNOSIS — R161 Splenomegaly, not elsewhere classified: Secondary | ICD-10-CM | POA: Diagnosis not present

## 2017-01-11 DIAGNOSIS — Z79899 Other long term (current) drug therapy: Secondary | ICD-10-CM | POA: Diagnosis not present

## 2017-01-11 DIAGNOSIS — D696 Thrombocytopenia, unspecified: Secondary | ICD-10-CM

## 2017-01-11 DIAGNOSIS — C7802 Secondary malignant neoplasm of left lung: Secondary | ICD-10-CM | POA: Diagnosis not present

## 2017-01-11 DIAGNOSIS — Z7189 Other specified counseling: Secondary | ICD-10-CM

## 2017-01-11 DIAGNOSIS — C3412 Malignant neoplasm of upper lobe, left bronchus or lung: Secondary | ICD-10-CM

## 2017-01-11 DIAGNOSIS — C7951 Secondary malignant neoplasm of bone: Secondary | ICD-10-CM | POA: Diagnosis not present

## 2017-01-11 DIAGNOSIS — Z5111 Encounter for antineoplastic chemotherapy: Secondary | ICD-10-CM | POA: Diagnosis not present

## 2017-01-11 LAB — CBC WITH DIFFERENTIAL/PLATELET
Basophils Absolute: 0.1 10*3/uL (ref 0–0.1)
Basophils Relative: 1 %
Eosinophils Absolute: 0.1 10*3/uL (ref 0–0.7)
Eosinophils Relative: 1 %
HCT: 34.1 % — ABNORMAL LOW (ref 40.0–52.0)
HEMOGLOBIN: 11.8 g/dL — AB (ref 13.0–18.0)
LYMPHS ABS: 0.7 10*3/uL — AB (ref 1.0–3.6)
LYMPHS PCT: 6 %
MCH: 33.1 pg (ref 26.0–34.0)
MCHC: 34.7 g/dL (ref 32.0–36.0)
MCV: 95.5 fL (ref 80.0–100.0)
Monocytes Absolute: 1.2 10*3/uL — ABNORMAL HIGH (ref 0.2–1.0)
Monocytes Relative: 10 %
NEUTROS PCT: 82 %
Neutro Abs: 9.7 10*3/uL — ABNORMAL HIGH (ref 1.4–6.5)
Platelets: 58 10*3/uL — ABNORMAL LOW (ref 150–440)
RBC: 3.58 MIL/uL — AB (ref 4.40–5.90)
RDW: 13.4 % (ref 11.5–14.5)
WBC: 11.7 10*3/uL — AB (ref 3.8–10.6)

## 2017-01-11 LAB — COMPREHENSIVE METABOLIC PANEL
ALK PHOS: 97 U/L (ref 38–126)
ALT: 46 U/L (ref 17–63)
ANION GAP: 7 (ref 5–15)
AST: 39 U/L (ref 15–41)
Albumin: 3.1 g/dL — ABNORMAL LOW (ref 3.5–5.0)
BILIRUBIN TOTAL: 3 mg/dL — AB (ref 0.3–1.2)
BUN: 40 mg/dL — ABNORMAL HIGH (ref 6–20)
CALCIUM: 7.8 mg/dL — AB (ref 8.9–10.3)
CO2: 24 mmol/L (ref 22–32)
Chloride: 95 mmol/L — ABNORMAL LOW (ref 101–111)
Creatinine, Ser: 1.48 mg/dL — ABNORMAL HIGH (ref 0.61–1.24)
GFR, EST AFRICAN AMERICAN: 54 mL/min — AB (ref >60)
GFR, EST NON AFRICAN AMERICAN: 47 mL/min — AB (ref >60)
GLUCOSE: 148 mg/dL — AB (ref 65–99)
POTASSIUM: 4.5 mmol/L (ref 3.5–5.1)
Sodium: 126 mmol/L — ABNORMAL LOW (ref 135–145)
TOTAL PROTEIN: 6.1 g/dL — AB (ref 6.5–8.1)

## 2017-01-11 MED ORDER — FENTANYL 100 MCG/HR TD PT72: 100.0000 ug | MEDICATED_PATCH | TRANSDERMAL | 0 refills | Status: AC

## 2017-01-11 MED ORDER — SODIUM CHLORIDE 0.9 % IV SOLN
1000.0000 mg | Freq: Once | INTRAVENOUS | Status: AC
  Administered 2017-01-11: 1000 mg via INTRAVENOUS
  Filled 2017-01-11: qty 40

## 2017-01-11 MED ORDER — DEXAMETHASONE SODIUM PHOSPHATE 10 MG/ML IJ SOLN
10.0000 mg | Freq: Once | INTRAMUSCULAR | Status: AC
  Administered 2017-01-11: 10 mg via INTRAVENOUS
  Filled 2017-01-11: qty 1

## 2017-01-11 MED ORDER — ONDANSETRON HCL 8 MG PO TABS: ORAL_TABLET | ORAL | 0 refills | Status: AC

## 2017-01-11 MED ORDER — SODIUM CHLORIDE 0.9 % IV SOLN
Freq: Once | INTRAVENOUS | Status: AC
  Administered 2017-01-11: 11:00:00 via INTRAVENOUS
  Filled 2017-01-11: qty 1000

## 2017-01-11 MED ORDER — DEXAMETHASONE 4 MG PO TABS: ORAL_TABLET | ORAL | 0 refills | Status: AC

## 2017-01-11 MED ORDER — ONDANSETRON 8 MG PO TBDP
8.0000 mg | ORAL_TABLET | Freq: Once | ORAL | Status: AC
  Administered 2017-01-11: 8 mg via ORAL
  Filled 2017-01-11: qty 1

## 2017-01-11 MED ORDER — SODIUM CHLORIDE 0.9 % IV SOLN: Freq: Once | INTRAVENOUS | Status: DC

## 2017-01-11 MED ORDER — CYANOCOBALAMIN 1000 MCG/ML IJ SOLN
1000.0000 ug | Freq: Once | INTRAMUSCULAR | Status: AC
  Administered 2017-01-11: 1000 ug via INTRAMUSCULAR
  Filled 2017-01-11: qty 1

## 2017-01-11 MED ORDER — FOLIC ACID 1 MG PO TABS: 1.0000 mg | ORAL_TABLET | Freq: Every day | ORAL | 1 refills | Status: AC

## 2017-01-11 MED ORDER — PROCHLORPERAZINE MALEATE 10 MG PO TABS: 10.0000 mg | ORAL_TABLET | Freq: Four times a day (QID) | ORAL | 1 refills | Status: AC | PRN

## 2017-01-11 NOTE — Progress Notes (Signed)
Patient here today for follow up.  Patient states no new concerns today  

## 2017-01-11 NOTE — Progress Notes (Signed)
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   Patient's left hip  Fracture is healing well.  No postoperative complications noted. He is expecting  To have his staples taken out soon.   In general his pain is well controlled.  Is currently on fentanyl patch 100 g Every 3 day  He is takingoxycodone sparingly.He continues  Methadone taper.  He denies any bleeding.He otherwise denies any worsening shortness of breath or hemoptysis. Chronic mild cough.  Patient is walking with a walker.  ROS: A complete 10 point review of system is done which is negative except mentioned above in history of present illness  MEDICAL HISTORY:  Past Medical History:  Diagnosis Date  . Benign non-nodular prostatic hyperplasia with lower urinary tract symptoms   . Bladder cancer ([place])   . Chronic hepatitis C without hepatic coma ([place])   . Cirrhosis of liver ([place])   . PUD (peptic ulcer disease)   . Rotator cuff tendinitis, right   . Spondylosis of cervical region without myelopathy or radiculopathy   . Tobacco abuse     SURGICAL HISTORY: Past Surgical History:  Procedure Laterality Date  .  ABLATION LIVER TUMORS W/CRYOSURGICAL  2012  . CYSTOTOMY W/EXCISION BLADDER TUMOR  2012  . HERNIA REPAIR  1996  . REPAIR PERONEAL TENDONS ANKLE  1976  . TOTAL HIP ARTHROPLASTY Left 12/26/2016   Procedure: HIP HEMIARTHROPLASTY POSSIBLE TOTAL HIP;  Surgeon: Hessie Knows, MD;  Location: [facility] ORS;  Service: Orthopedics;  Laterality: Left;    SOCIAL HISTORY: Quit smoking- 40ppd; no alcohhol now. [place] Social History   Social History  . Marital status: Widowed    Spouse name: N/A  . Number of children: N/A  . Years of education: N/A   Occupational History  . Not on file.   Social History Main Topics  . Smoking status: Former Smoker    Packs/day: 1.00    Years: 49.00    Types: Cigarettes  . Smokeless tobacco: Never Used  . Alcohol use No  . Drug use: No  . Sexual activity: Not on file   Other Topics Concern  . Not on file   Social History Narrative  . No narrative on file    FAMILY HISTORY: Family History  Problem Relation Age of Onset  . Lung cancer Father   . Diabetes type II Maternal Grandmother   . Lung cancer Mother   . Mental illness Brother   . Other Brother     h/o substance abuse    ALLERGIES:  is allergic to iodine.  MEDICATIONS:  Current Outpatient Prescriptions  Medication Sig Dispense Refill  . calcium-vitamin D (OSCAL  WITH D) 500-200 MG-UNIT tablet Take 1 tablet by mouth 2 (two) times daily. 30 tablet 0  . diphenhydrAMINE (BENADRYL) 50 MG tablet Take 50 mg by mouth daily as needed for itching.    . enoxaparin (LOVENOX) 40 MG/0.4ML injection Inject 0.4 mLs (40 mg total) into the skin daily. 14 Syringe 0  . fentaNYL (DURAGESIC - DOSED MCG/HR) 100 MCG/HR Place 1 patch (100 mcg total) onto the skin every 3 (three) days. 10 patch 0  . furosemide (LASIX) 80 MG tablet Take 1 tablet by mouth daily.    Marland Kitchen lactulose (CHRONULAC) 10 GM/15ML solution Take 15 mLs by mouth daily as needed for constipation.    . meloxicam (MOBIC) 15 MG tablet Take 1  tablet by mouth daily.    . methadone (DOLOPHINE) 10 MG/5ML solution Take 40 mg by mouth daily.     Marland Kitchen oxyCODONE 10 MG TABS Take 1 tablet (10 mg total) by mouth every 8 (eight) hours as needed for breakthrough pain. 15 tablet 0  . spironolactone (ALDACTONE) 100 MG tablet Take 100 mg by mouth 2 (two) times daily.     . tamsulosin (FLOMAX) 0.4 MG CAPS capsule Take 1 capsule by mouth daily.    Marland Kitchen dexamethasone (DECADRON) 4 MG tablet Take one pill AM & PM x 3 days; start the day prior to chemo. 60 tablet 0  . folic acid (FOLVITE) 1 MG tablet Take 1 tablet (1 mg total) by mouth daily. 90 tablet 1  . ondansetron (ZOFRAN) 8 MG tablet 1 pill every 8 hours as needed for nasuea/vomitting 40 tablet 0  . prochlorperazine (COMPAZINE) 10 MG tablet Take 1 tablet (10 mg total) by mouth every 6 (six) hours as needed for nausea or vomiting. 40 tablet 1   No current facility-administered medications for this visit.       Marland Kitchen  PHYSICAL EXAMINATION: ECOG PERFORMANCE STATUS: 1 - Symptomatic but completely ambulatory  Vitals:   01/11/17 1022  BP: (!) 105/54  Pulse: 83  Temp: 99 F (37.2 C)   Filed Weights   01/11/17 1022  Weight: 164 lb 4 oz (74.5 kg)    GENERAL: Well-nourished well-developed; Alert, no distress and comfortable. Patient is walking with the cane. He is accompanied by his son-in-law. EYES: no pallor or icterus OROPHARYNX: no thrush or ulceration; good dentition  NECK: supple, no masses felt LYMPH:  no palpable lymphadenopathy in the cervical, axillary or inguinal regions LUNGS: clear to auscultation and  No wheeze or crackles HEART/CVS: regular rate & rhythm and no murmurs; positive for bilateral lower extremity edema ABDOMEN: abdomen soft, non-tender and normal bowel sounds; splenomegaly cannot be appreciated. Musculoskeletal:no cyanosis of digits and no clubbing  PSYCH: alert & oriented x 3 with fluent speech NEURO: no focal motor/sensory deficits SKIN:  Positive for spider angioma  upper trunk. Chronic venous stasis changes noted bilateral lower extremities.  LABORATORY DATA:  I have reviewed the data as listed Lab Results  Component Value Date   WBC 11.7 (H) 01/11/2017   HGB 11.8 (L) 01/11/2017   HCT 34.1 (L) 01/11/2017   MCV 95.5 01/11/2017   PLT 58 (L) 01/11/2017    Recent Labs  12/22/16 0948 12/25/16 1648 12/26/16 0446 12/27/16 0548 01/11/17 0941  NA 134* 133* 135 133* 126*  K 4.1 3.9 4.8 4.5 4.5  CL 102 99* 105 106 95*  CO2 '24 25 24 23 24  '$ GLUCOSE 80 97 122* 168* 148*  BUN 42* 29* 29* 37* 40*  CREATININE 1.55* 1.35* 1.15  1.26* 1.48*  CALCIUM 8.7* 9.0 8.2* 7.8* 7.8*  GFRNONAA 44* 52* >60 57* 47*  GFRAA 51* >60 >60 >60 54*  PROT 6.7 7.0  --   --  6.1*  ALBUMIN 3.3* 3.3*  --   --  3.1*  AST 83* 93*  --   --  39  ALT 47 55  --   --  46  ALKPHOS 84 92  --   --  97  BILITOT 1.6* 2.0*  --   --  3.0*  BILIDIR  --  0.7*  --   --   --   IBILI  --  1.3*  --   --   --     RADIOGRAPHIC STUDIES: I have personally reviewed the radiological images as listed and agreed with the findings in the report. Mr Jeri Cos Wo Contrast  Result Date: 12/22/2016 CLINICAL DATA:  Recently diagnosed lung cancer.  Gait instability. EXAM: MRI HEAD WITHOUT AND WITH CONTRAST TECHNIQUE: Multiplanar, multiecho pulse sequences of the brain and surrounding structures were obtained without and with intravenous contrast. CONTRAST:  80m MULTIHANCE GADOBENATE DIMEGLUMINE 529 MG/ML IV SOLN COMPARISON:  None. FINDINGS: Brain: There is no evidence of acute infarct, intracranial hemorrhage, mass, midline shift, or extra-axial fluid collection. The ventricles and sulci are within normal limits for age. Scattered punctate foci of cerebral white matter T2 hyperintensity are nonspecific but compatible with minimal chronic small vessel ischemic disease. No abnormal enhancement is identified. Vascular: Major intracranial vascular flow voids are preserved. Skull and upper cervical spine: Prior  biparietal craniectomy. Patchy regions of heterogeneously diminished bone marrow signal intensity, particularly posteriorly in the skull as well as in the upper cervical spine. No destructive osseous lesion identified. Sinuses/Orbits: Unremarkable orbits. Paranasal sinuses and mastoid air cells are clear. Other: None. IMPRESSION: 1. No evidence of intracranial metastatic disease. 2. Heterogeneous marrow signal in the skull and upper cervical spine, nonspecific. Osseous metastatic disease not excluded. No destructive osseous lesion. Electronically Signed   By: ALogan BoresM.D.   On: 12/22/2016 13:07   Nm Pet Image Initial (pi) Skull Base To Thigh  Result Date: 12/18/2016 CLINICAL DATA:  Initial treatment strategy for lung mass. EXAM: NUCLEAR MEDICINE PET SKULL BASE TO THIGH TECHNIQUE: 12.4 mCi F-18 FDG was injected intravenously. Full-ring PET imaging was performed from the skull base to thigh after the radiotracer. CT data was obtained and used for attenuation correction and anatomic localization. FASTING BLOOD GLUCOSE:  Value: 91 mg/dl COMPARISON:  None. FINDINGS: NECK No hypermetabolic lymph nodes in the neck. CHEST Dominant mass in the LEFT upper lobe measures 4.1 cm with SUV max equal 16.5. Nodule in the LEFT upper lobe measures 2.9 cm (image 56, series 3) with SUV max equal 4.5. Hypermetabolic LEFT hilar lymph node with SUV max equal 6.0. Smaller nodule in the LEFT lower lobe measures 10 mm (image 85, series 5) without clear metabolic activity. Similar nodule in the RIGHT lower lobe measuring 7 mm (image 87, series 3) with trace metabolic activity. Nodule over the RIGHT hemidiaphragm measures 9 mm. ABDOMEN/PELVIS No abnormal hypermetabolic activity within the liver, pancreas, adrenal glands, or spleen. No hypermetabolic lymph nodes in the abdomen or pelvis. The liver has a shrunken nodular contour with enlargement of the caudate lobe. Spleen is enlarged. Small LEFT bladder diverticulum. SKELETON  Hypermetabolic destructive lesion within the RIGHT pedicle of the T2 vertebral body with SUV max equal 6.4. Expansile lesion within the anterior RIGHT third rib measures 5 by 3.9 cm with intense  metabolic activity. Distant skeletal metastasis within the LEFT femoral neck with SUV max equals 7.3. IMPRESSION: 1. Intense hypermetabolic LEFT upper lobe mass consists with primary bronchogenic carcinoma. 2. Large hypermetabolic nodule in the LEFT upper lobe. Favor metastatic disease. 3. Multiple bilateral lower lobe pulmonary nodules without metabolic activity concerning for pulmonary metastasis. 4. Hypermetabolic LEFT hilar nodal metastasis. 5. Hypermetabolic expansile skeletal metastasis including the RIGHT vertebral body pedicle at T2 and an anterior RIGHT rib. More distant skeletal metastasis in the LEFT femur neck. 6. Splenomegaly and liver cirrhosis. These results will be called to the ordering clinician or representative by the Radiologist Assistant, and communication documented in the PACS or zVision Dashboard. Electronically Signed   By: Suzy Bouchard M.D.   On: 12/18/2016 10:09   Dg Chest Portable 1 View  Result Date: 12/25/2016 CLINICAL DATA:  Preop for left hip fracture. EXAM: PORTABLE CHEST 1 VIEW COMPARISON:  Head CT 12/18/2016 FINDINGS: 2 left lung mass and right third rib metastasis with pleural disease, known from recent PET-CT. No cardiomegaly. There is no edema, consolidation, effusion, or pneumothorax. No acute fracture IMPRESSION: 1. No acute finding. 2. Known left lung masses and right third rib metastasis. Electronically Signed   By: Monte Fantasia M.D.   On: 12/25/2016 17:16   Dg Hip Operative Unilat W Or W/o Pelvis Left  Result Date: 12/26/2016 CLINICAL DATA:  Left hip surgery EXAM: OPERATIVE LEFT HIP (WITH PELVIS IF PERFORMED) 1 VIEWS TECHNIQUE: Fluoroscopic spot image(s) were submitted for interpretation post-operatively. COMPARISON:  None. FINDINGS: Left hip arthroplasty without  failure complication. No fracture or dislocation. IMPRESSION: Left hip arthroplasty. Electronically Signed   By: Kathreen Devoid   On: 12/26/2016 14:00   Dg Hip Unilat W Or W/o Pelvis 2-3 Views Left  Result Date: 12/26/2016 CLINICAL DATA:  Less post left hip joint replacement.  Days 0. EXAM: DG HIP (WITH OR WITHOUT PELVIS) 2-3V LEFT COMPARISON:  Preoperative radiograph of December 25, 2016 FINDINGS: The patient has undergone total left hip joint prosthesis placement. Radiographic positioning of the prosthetic components is good. The interface with the native bone appears normal. Surgical skin staples are noted laterally. IMPRESSION: There is no acute postprocedure complication following left hip joint prosthesis placement. Electronically Signed   By: David  Martinique M.D.   On: 12/26/2016 13:51   Dg Hip Unilat With Pelvis 2-3 Views Left  Result Date: 12/25/2016 CLINICAL DATA:  Fall 1 week ago with subsequent pain. Initial encounter. EXAM: DG HIP (WITH OR WITHOUT PELVIS) 2-3V LEFT COMPARISON:  PET-CT 12/18/2016 FINDINGS: Transcervical left femoral neck fracture, acute appearing, with 9 mm of displacement. This is pathologic by prior PET-CT. No dislocation. No visible pelvic ring fracture. IMPRESSION: Displaced left femoral neck fracture, pathologic based on PET-CT 12/18/2016 Electronically Signed   By: Monte Fantasia M.D.   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 Awaiting on molecular markers- PDL 1.   #  Giving the symptomatic disease/ burden of the disease-  Proceed with palliative chemotherapy-  Alimta alone. [ Hold carboplatin- secondary to low platelets;  And upcoming plan for radiation to the hip];  Hold Avastin given the recent hip surgery. Discussed that the goal of treatment would be palliative/not curative. He and his do understand complications from chemotherapy- given his  cirrhosis/thrombo-cytopenia/ chronic kidney disease.    # Pain control-secondary to bony metastasis.  Well controlled. Continue  Fentanyl patch 100 g every 3 days.New prescription Given.  Taking oxycodone only as needed.  Patient is on methadone taper.  #History of cirrhosis and splenomegaly-seems to be compensated.    # prescription for  Folic acid;  Dexamethasone- prior to chemotherapy;  Also antiemetics given  # Recheck labs in 10 days;  Follow-up in 3 weeks carbo Alimta in 3 weeks.  Also discussed with Dr. Donella Stade.  All questions were answered. The patient knows to call the clinic with any problems, questions or concerns.     Cammie Sickle, MD 01/12/2017 6:08 PM

## 2017-01-11 NOTE — Assessment & Plan Note (Addendum)
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 Awaiting on molecular markers- PDL 1.   #  Giving the symptomatic disease/ burden of the disease-  Proceed with palliative chemotherapy-  Alimta alone. [ Hold carboplatin- secondary to low platelets;  And upcoming plan for radiation to the hip];  Hold Avastin given the recent hip surgery. Discussed that the goal of treatment would be palliative/not curative. He and his do understand complications from chemotherapy- given his cirrhosis/thrombo-cytopenia/ chronic kidney disease.    # Pain control-secondary to bony metastasis.  Well controlled. Continue  Fentanyl patch 100 g every 3 days.New prescription Given.  Taking oxycodone only as needed.  Patient is on methadone taper.  #History of cirrhosis and splenomegaly-seems to be compensated.    # prescription for  Folic acid;  Dexamethasone- prior to chemotherapy;  Also antiemetics given  # Recheck labs in 10 days;  Follow-up in 3 weeks carbo Alimta in 3 weeks.  Also discussed with Dr. Donella Stade.

## 2017-01-12 DIAGNOSIS — Z7189 Other specified counseling: Secondary | ICD-10-CM | POA: Insufficient documentation

## 2017-01-15 ENCOUNTER — Other Ambulatory Visit: Payer: Self-pay | Admitting: *Deleted

## 2017-01-15 ENCOUNTER — Ambulatory Visit
Admission: RE | Admit: 2017-01-15 | Discharge: 2017-01-15 | Disposition: A | Discharge status: Home / Self Care | Payer: Medicare Other | Source: Ambulatory Visit | Attending: Radiation Oncology | Admitting: Radiation Oncology

## 2017-01-15 MED ORDER — OXYCODONE HCL 10 MG PO TABS: 10.0000 mg | ORAL_TABLET | Freq: Three times a day (TID) | ORAL | 0 refills | Status: AC | PRN

## 2017-01-15 NOTE — Telephone Encounter (Signed)
Daughter called stating her father changed his mind about wanting a refill of his Oxycodone. Please advise how many you would like to give if in agreement to refill it.

## 2017-01-15 NOTE — Telephone Encounter (Signed)
rx approved and pt here to be picked up

## 2017-01-16 ENCOUNTER — Telehealth: Payer: Self-pay | Admitting: *Deleted

## 2017-01-16 DIAGNOSIS — R059 Cough, unspecified: Secondary | ICD-10-CM

## 2017-01-16 DIAGNOSIS — R05 Cough: Secondary | ICD-10-CM

## 2017-01-16 NOTE — Telephone Encounter (Signed)
Spoke with pt's daughter, Threasa Beards, and advised of MD recommendations. Melanie verbalized understanding.

## 2017-01-16 NOTE — Telephone Encounter (Signed)
Pt's daughter called in to report that patient is starting to experience shaking, shortness of breath with exertion, cough, nasal congestion, and sore throat. Pt is afebrile. Informed daughter that since pt is tapering off methadone that he could be experiencing some withdrawal symptoms. Daughter is concerned about shortness of breath, cough, nasal congestion, and sore throat. Please advise.

## 2017-01-16 NOTE — Telephone Encounter (Signed)
Per Dr Rogue Bussing:  Please advise pt to take Claritin and get a chest xray.  I will place order for CXR please notified pt.

## 2017-01-17 ENCOUNTER — Inpatient Hospital Stay
Admission: EM | Admit: 2017-01-17 | Discharge: 2017-02-09 | DRG: 871 | Disposition: E | Payer: Medicare Other | Attending: Internal Medicine | Admitting: Internal Medicine

## 2017-01-17 ENCOUNTER — Encounter: Payer: Self-pay | Admitting: Emergency Medicine

## 2017-01-17 ENCOUNTER — Other Ambulatory Visit: Payer: Self-pay | Admitting: Internal Medicine

## 2017-01-17 ENCOUNTER — Other Ambulatory Visit: Payer: Self-pay

## 2017-01-17 ENCOUNTER — Inpatient Hospital Stay: Payer: Medicare Other

## 2017-01-17 ENCOUNTER — Ambulatory Visit
Admission: RE | Admit: 2017-01-17 | Discharge: 2017-01-17 | Disposition: A | Discharge status: Home / Self Care | Payer: Medicare Other | Source: Ambulatory Visit | Attending: Internal Medicine | Admitting: Internal Medicine

## 2017-01-17 ENCOUNTER — Telehealth: Payer: Self-pay | Admitting: Internal Medicine

## 2017-01-17 DIAGNOSIS — R5081 Fever presenting with conditions classified elsewhere: Secondary | ICD-10-CM | POA: Diagnosis present

## 2017-01-17 DIAGNOSIS — B182 Chronic viral hepatitis C: Secondary | ICD-10-CM | POA: Diagnosis present

## 2017-01-17 DIAGNOSIS — F1120 Opioid dependence, uncomplicated: Secondary | ICD-10-CM | POA: Diagnosis present

## 2017-01-17 DIAGNOSIS — Z79899 Other long term (current) drug therapy: Secondary | ICD-10-CM

## 2017-01-17 DIAGNOSIS — R6520 Severe sepsis without septic shock: Secondary | ICD-10-CM | POA: Diagnosis present

## 2017-01-17 DIAGNOSIS — C3412 Malignant neoplasm of upper lobe, left bronchus or lung: Secondary | ICD-10-CM | POA: Diagnosis present

## 2017-01-17 DIAGNOSIS — G893 Neoplasm related pain (acute) (chronic): Secondary | ICD-10-CM | POA: Diagnosis present

## 2017-01-17 DIAGNOSIS — D696 Thrombocytopenia, unspecified: Secondary | ICD-10-CM | POA: Diagnosis not present

## 2017-01-17 DIAGNOSIS — R05 Cough: Secondary | ICD-10-CM

## 2017-01-17 DIAGNOSIS — D701 Agranulocytosis secondary to cancer chemotherapy: Secondary | ICD-10-CM | POA: Diagnosis not present

## 2017-01-17 DIAGNOSIS — Z87891 Personal history of nicotine dependence: Secondary | ICD-10-CM | POA: Diagnosis not present

## 2017-01-17 DIAGNOSIS — R739 Hyperglycemia, unspecified: Secondary | ICD-10-CM | POA: Diagnosis present

## 2017-01-17 DIAGNOSIS — E8809 Other disorders of plasma-protein metabolism, not elsewhere classified: Secondary | ICD-10-CM | POA: Diagnosis present

## 2017-01-17 DIAGNOSIS — Z7189 Other specified counseling: Secondary | ICD-10-CM | POA: Diagnosis not present

## 2017-01-17 DIAGNOSIS — E872 Acidosis: Secondary | ICD-10-CM | POA: Diagnosis present

## 2017-01-17 DIAGNOSIS — Z66 Do not resuscitate: Secondary | ICD-10-CM | POA: Diagnosis present

## 2017-01-17 DIAGNOSIS — Z515 Encounter for palliative care: Secondary | ICD-10-CM | POA: Diagnosis not present

## 2017-01-17 DIAGNOSIS — N401 Enlarged prostate with lower urinary tract symptoms: Secondary | ICD-10-CM | POA: Diagnosis present

## 2017-01-17 DIAGNOSIS — E871 Hypo-osmolality and hyponatremia: Secondary | ICD-10-CM | POA: Diagnosis present

## 2017-01-17 DIAGNOSIS — A419 Sepsis, unspecified organism: Secondary | ICD-10-CM | POA: Diagnosis present

## 2017-01-17 DIAGNOSIS — D709 Neutropenia, unspecified: Secondary | ICD-10-CM

## 2017-01-17 DIAGNOSIS — D703 Neutropenia due to infection: Secondary | ICD-10-CM | POA: Diagnosis present

## 2017-01-17 DIAGNOSIS — J189 Pneumonia, unspecified organism: Secondary | ICD-10-CM | POA: Diagnosis not present

## 2017-01-17 DIAGNOSIS — K7460 Unspecified cirrhosis of liver: Secondary | ICD-10-CM | POA: Diagnosis present

## 2017-01-17 DIAGNOSIS — Z8711 Personal history of peptic ulcer disease: Secondary | ICD-10-CM | POA: Diagnosis not present

## 2017-01-17 DIAGNOSIS — I471 Supraventricular tachycardia: Secondary | ICD-10-CM | POA: Diagnosis present

## 2017-01-17 DIAGNOSIS — Z8551 Personal history of malignant neoplasm of bladder: Secondary | ICD-10-CM

## 2017-01-17 DIAGNOSIS — D6959 Other secondary thrombocytopenia: Secondary | ICD-10-CM | POA: Diagnosis present

## 2017-01-17 DIAGNOSIS — R410 Disorientation, unspecified: Secondary | ICD-10-CM

## 2017-01-17 DIAGNOSIS — Z96642 Presence of left artificial hip joint: Secondary | ICD-10-CM | POA: Diagnosis present

## 2017-01-17 DIAGNOSIS — T451X5A Adverse effect of antineoplastic and immunosuppressive drugs, initial encounter: Secondary | ICD-10-CM | POA: Diagnosis not present

## 2017-01-17 DIAGNOSIS — M47812 Spondylosis without myelopathy or radiculopathy, cervical region: Secondary | ICD-10-CM | POA: Diagnosis present

## 2017-01-17 DIAGNOSIS — C7951 Secondary malignant neoplasm of bone: Secondary | ICD-10-CM | POA: Diagnosis present

## 2017-01-17 DIAGNOSIS — Z791 Long term (current) use of non-steroidal anti-inflammatories (NSAID): Secondary | ICD-10-CM

## 2017-01-17 DIAGNOSIS — R059 Cough, unspecified: Secondary | ICD-10-CM

## 2017-01-17 DIAGNOSIS — R161 Splenomegaly, not elsewhere classified: Secondary | ICD-10-CM | POA: Diagnosis not present

## 2017-01-17 DIAGNOSIS — R740 Nonspecific elevation of levels of transaminase and lactic acid dehydrogenase [LDH]: Secondary | ICD-10-CM | POA: Diagnosis present

## 2017-01-17 DIAGNOSIS — Z7952 Long term (current) use of systemic steroids: Secondary | ICD-10-CM

## 2017-01-17 DIAGNOSIS — N179 Acute kidney failure, unspecified: Secondary | ICD-10-CM | POA: Diagnosis present

## 2017-01-17 DIAGNOSIS — R627 Adult failure to thrive: Secondary | ICD-10-CM | POA: Diagnosis present

## 2017-01-17 DIAGNOSIS — E875 Hyperkalemia: Secondary | ICD-10-CM | POA: Diagnosis present

## 2017-01-17 DIAGNOSIS — T380X5A Adverse effect of glucocorticoids and synthetic analogues, initial encounter: Secondary | ICD-10-CM | POA: Diagnosis present

## 2017-01-17 DIAGNOSIS — R4182 Altered mental status, unspecified: Secondary | ICD-10-CM

## 2017-01-17 DIAGNOSIS — Z801 Family history of malignant neoplasm of trachea, bronchus and lung: Secondary | ICD-10-CM

## 2017-01-17 DIAGNOSIS — C7802 Secondary malignant neoplasm of left lung: Secondary | ICD-10-CM | POA: Diagnosis not present

## 2017-01-17 LAB — COMPREHENSIVE METABOLIC PANEL
ALT: 64 U/L — AB (ref 17–63)
AST: 40 U/L (ref 15–41)
Albumin: 2.8 g/dL — ABNORMAL LOW (ref 3.5–5.0)
Alkaline Phosphatase: 90 U/L (ref 38–126)
Anion gap: 10 (ref 5–15)
BUN: 73 mg/dL — AB (ref 6–20)
CHLORIDE: 98 mmol/L — AB (ref 101–111)
CO2: 18 mmol/L — AB (ref 22–32)
CREATININE: 1.76 mg/dL — AB (ref 0.61–1.24)
Calcium: 7.9 mg/dL — ABNORMAL LOW (ref 8.9–10.3)
GFR calc Af Amer: 44 mL/min — ABNORMAL LOW (ref >60)
GFR calc non Af Amer: 38 mL/min — ABNORMAL LOW (ref >60)
Glucose, Bld: 66 mg/dL (ref 65–99)
POTASSIUM: 5.9 mmol/L — AB (ref 3.5–5.1)
SODIUM: 126 mmol/L — AB (ref 135–145)
Total Bilirubin: 5.7 mg/dL — ABNORMAL HIGH (ref 0.3–1.2)
Total Protein: 5.5 g/dL — ABNORMAL LOW (ref 6.5–8.1)

## 2017-01-17 LAB — CBC WITH DIFFERENTIAL/PLATELET
BASOS ABS: 0 10*3/uL (ref 0–0.1)
Basophils Relative: 0 %
EOS PCT: 1 %
Eosinophils Absolute: 0 10*3/uL (ref 0–0.7)
HCT: 27 % — ABNORMAL LOW (ref 40.0–52.0)
Hemoglobin: 9.1 g/dL — ABNORMAL LOW (ref 13.0–18.0)
Lymphocytes Relative: 29 %
Lymphs Abs: 0.1 10*3/uL — ABNORMAL LOW (ref 1.0–3.6)
MCH: 32.5 pg (ref 26.0–34.0)
MCHC: 33.7 g/dL (ref 32.0–36.0)
MCV: 96.2 fL (ref 80.0–100.0)
MONO ABS: 0 10*3/uL — AB (ref 0.2–1.0)
MONOS PCT: 5 %
NEUTROS PCT: 65 %
Neutro Abs: 0.2 10*3/uL — ABNORMAL LOW (ref 1.4–6.5)
PLATELETS: 15 10*3/uL — AB (ref 150–440)
RBC: 2.81 MIL/uL — AB (ref 4.40–5.90)
RDW: 13.1 % (ref 11.5–14.5)
WBC: 0.3 10*3/uL — AB (ref 3.8–10.6)

## 2017-01-17 LAB — AMMONIA: AMMONIA: 37 umol/L — AB (ref 9–35)

## 2017-01-17 LAB — TROPONIN I: Troponin I: <0.03 ng/mL (ref <0.03)

## 2017-01-17 LAB — LACTIC ACID, PLASMA
LACTIC ACID, VENOUS: 0.9 mmol/L (ref 0.5–1.9)
Lactic Acid, Venous: 2.4 mmol/L (ref 0.5–1.9)

## 2017-01-17 LAB — MRSA PCR SCREENING: MRSA by PCR: NEGATIVE

## 2017-01-17 MED ORDER — CEFEPIME HCL 2 G IJ SOLR
2.0000 g | Freq: Once | INTRAMUSCULAR | Status: AC
  Administered 2017-01-17: 2 g via INTRAVENOUS
  Filled 2017-01-17: qty 2

## 2017-01-17 MED ORDER — VANCOMYCIN HCL IN DEXTROSE 750-5 MG/150ML-% IV SOLN
750.0000 mg | INTRAVENOUS | Status: DC
  Administered 2017-01-18 (×2): 750 mg via INTRAVENOUS
  Filled 2017-01-17 (×4): qty 150

## 2017-01-17 MED ORDER — PROCHLORPERAZINE MALEATE 10 MG PO TABS
10.0000 mg | ORAL_TABLET | Freq: Four times a day (QID) | ORAL | Status: DC | PRN
  Filled 2017-01-17: qty 1

## 2017-01-17 MED ORDER — SODIUM CHLORIDE 0.9 % IV BOLUS (SEPSIS)
250.0000 mL | Freq: Once | INTRAVENOUS | Status: AC
  Administered 2017-01-17: 250 mL via INTRAVENOUS

## 2017-01-17 MED ORDER — FOLIC ACID 1 MG PO TABS
1.0000 mg | ORAL_TABLET | Freq: Every day | ORAL | Status: DC
  Administered 2017-01-17 – 2017-01-20 (×4): 1 mg via ORAL
  Filled 2017-01-17 (×5): qty 1

## 2017-01-17 MED ORDER — FENTANYL 50 MCG/HR TD PT72
100.0000 ug | MEDICATED_PATCH | TRANSDERMAL | Status: DC
  Administered 2017-01-19 – 2017-01-20 (×2): 100 ug via TRANSDERMAL
  Filled 2017-01-17: qty 1
  Filled 2017-01-17 (×2): qty 2

## 2017-01-17 MED ORDER — SODIUM CHLORIDE 0.9 % IV BOLUS (SEPSIS)
1000.0000 mL | Freq: Once | INTRAVENOUS | Status: AC
  Administered 2017-01-17: 1000 mL via INTRAVENOUS

## 2017-01-17 MED ORDER — CALCIUM CARBONATE-VITAMIN D 500-200 MG-UNIT PO TABS
1.0000 | ORAL_TABLET | Freq: Two times a day (BID) | ORAL | Status: DC
  Administered 2017-01-18 – 2017-01-20 (×6): 1 via ORAL
  Filled 2017-01-17 (×7): qty 1

## 2017-01-17 MED ORDER — MORPHINE SULFATE (PF) 4 MG/ML IV SOLN
4.0000 mg | Freq: Once | INTRAVENOUS | Status: AC
  Administered 2017-01-17: 4 mg via INTRAVENOUS

## 2017-01-17 MED ORDER — TAMSULOSIN HCL 0.4 MG PO CAPS
0.4000 mg | ORAL_CAPSULE | Freq: Every day | ORAL | Status: DC
  Administered 2017-01-18 – 2017-01-20 (×3): 0.4 mg via ORAL
  Filled 2017-01-17 (×4): qty 1

## 2017-01-17 MED ORDER — ALBUTEROL SULFATE (2.5 MG/3ML) 0.083% IN NEBU: 2.5000 mg | INHALATION_SOLUTION | Freq: Four times a day (QID) | RESPIRATORY_TRACT | Status: DC | PRN

## 2017-01-17 MED ORDER — CEFEPIME HCL 2 G IJ SOLR
2.0000 g | Freq: Two times a day (BID) | INTRAMUSCULAR | Status: DC
  Administered 2017-01-18 – 2017-01-21 (×7): 2 g via INTRAVENOUS
  Filled 2017-01-17 (×9): qty 2

## 2017-01-17 MED ORDER — ONDANSETRON HCL 4 MG PO TABS
8.0000 mg | ORAL_TABLET | Freq: Three times a day (TID) | ORAL | Status: DC | PRN
Start: 2017-01-17 — End: 2017-01-21
  Administered 2017-01-21: 04:00:00 8 mg via ORAL
  Filled 2017-01-17: qty 2

## 2017-01-17 MED ORDER — VANCOMYCIN HCL IN DEXTROSE 1-5 GM/200ML-% IV SOLN
1000.0000 mg | Freq: Once | INTRAVENOUS | Status: AC
  Administered 2017-01-17: 1000 mg via INTRAVENOUS
  Filled 2017-01-17: qty 200

## 2017-01-17 MED ORDER — ACETAMINOPHEN 650 MG RE SUPP: 650.0000 mg | Freq: Four times a day (QID) | RECTAL | Status: DC | PRN

## 2017-01-17 MED ORDER — MORPHINE SULFATE (PF) 4 MG/ML IV SOLN
INTRAVENOUS | Status: AC
  Administered 2017-01-17: 4 mg via INTRAVENOUS
  Filled 2017-01-17: qty 1

## 2017-01-17 MED ORDER — CEFEPIME-DEXTROSE 2 GM/50ML IV SOLR
2.0000 g | Freq: Once | INTRAVENOUS | Status: DC
  Filled 2017-01-17 (×2): qty 50

## 2017-01-17 MED ORDER — ACETAMINOPHEN 325 MG PO TABS: 650.0000 mg | ORAL_TABLET | Freq: Four times a day (QID) | ORAL | Status: DC | PRN

## 2017-01-17 MED ORDER — CEPHALEXIN 500 MG PO CAPS: 500.0000 mg | ORAL_CAPSULE | Freq: Three times a day (TID) | ORAL | 0 refills | Status: DC

## 2017-01-17 MED ORDER — SODIUM CHLORIDE 0.9 % IV SOLN
INTRAVENOUS | Status: DC
  Administered 2017-01-17 – 2017-01-20 (×8): via INTRAVENOUS

## 2017-01-17 MED ORDER — DEXTROSE 5 % IV SOLN: 2.0000 g | Freq: Once | INTRAVENOUS | Status: DC

## 2017-01-17 NOTE — ED Notes (Signed)
Family at bedside. 

## 2017-01-17 NOTE — Progress Notes (Signed)
Pharmacy Antibiotic Note  [name] is a 71 y.o. male admitted on 01/16/2017 with HCAP.  Pharmacy has been consulted for cefepime and vancomycin dosing. Patient diagnosed with stage IV lung cancer.   Plan: Vancomycin '1000mg'$  IV x1 given in the ED. Will initiate vancomycin '750mg'$  Q24hr per stacked protocol. Goal trough 15-20 mcg/mL. Draw trough prior to the fifth dose.   Cefepime 2 g Q12h (CrCl 38.15m/min)  Height: '5\' 8"'$  (172.7 cm) Weight: 165 lb (74.8 kg) IBW/kg (Calculated) : 68.4  Temp (24hrs), Avg:100.4 F (38 C), Min:98.5 F (36.9 C), Max:102.2 F (39 C)   Recent Labs Lab 01/11/17 0941 02/06/2017 1726 02/02/2017 1729 01/29/2017 1800  WBC 11.7*  --   --  0.3*  CREATININE 1.48* 1.76*  --   --   LATICACIDVEN  --   --  2.4*  --     Estimated Creatinine Clearance: 38.3 mL/min (A) (by C-G formula based on SCr of 1.76 mg/dL (H)).    Allergies  Allergen Reactions  . Iodine Hives    Antimicrobials this admission: Cefepime 5/9 >> Vancomycin 5/9>>  Microbiology results: 5/9 BCx: pending 4/16 MRSA PCR: negative  Thank you for allowing pharmacy to be a part of this patient's care.  KLoree Fee PharmD 01/26/2017 6:45 PM

## 2017-01-17 NOTE — ED Notes (Signed)
[name], daughter [phone]

## 2017-01-17 NOTE — ED Notes (Signed)
PT ASKING FOR SOMETHING SMALL TO EAT AFTER CT RESULTS, PER PT.

## 2017-01-17 NOTE — ED Notes (Addendum)
Bed ready pending CT scans , CCU notified
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
## 2017-01-17 NOTE — ED Notes (Signed)
Waiting on cefepime from pharmacy, ED MD gives ok to start Vancomycin first.
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
## 2017-01-17 NOTE — ED Notes (Signed)
Date and time results received: 01/12/2017 1830  (use smartphrase ".now" to insert current time)  Test: WBC Critical Value: 0.3  Name of Provider Notified: [email or web address]
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
## 2017-01-17 NOTE — ED Notes (Signed)
Ice cream given at this time
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
## 2017-01-17 NOTE — ED Notes (Signed)
Date and time results received: 01/13/2017 1830 (use smartphrase ".now" to insert current time)  Test: platlet Critical Value: 15  Name of Provider Notified: [email or web address]

## 2017-01-17 NOTE — ED Notes (Signed)
Date and time results received: 01/27/2017 1810 (use smartphrase ".now" to insert current time)  Test: lactic Critical Value: 2.4  Name of Provider Notified: paduchowski
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
## 2017-01-17 NOTE — ED Notes (Signed)
EDP has read CT scans, ok to give report and transport to CCU at this time

## 2017-01-17 NOTE — Telephone Encounter (Signed)
Please inform patient/daughter that patient might have pneumonia-based on chest x-ray. Recommend antibiotics-  Keflex 500 TID x 10 days.

## 2017-01-18 ENCOUNTER — Other Ambulatory Visit: Payer: Self-pay | Admitting: Internal Medicine

## 2017-01-18 DIAGNOSIS — K7460 Unspecified cirrhosis of liver: Secondary | ICD-10-CM

## 2017-01-18 DIAGNOSIS — R161 Splenomegaly, not elsewhere classified: Secondary | ICD-10-CM

## 2017-01-18 DIAGNOSIS — Z96642 Presence of left artificial hip joint: Secondary | ICD-10-CM

## 2017-01-18 DIAGNOSIS — Z87891 Personal history of nicotine dependence: Secondary | ICD-10-CM

## 2017-01-18 DIAGNOSIS — T451X5A Adverse effect of antineoplastic and immunosuppressive drugs, initial encounter: Secondary | ICD-10-CM

## 2017-01-18 DIAGNOSIS — A419 Sepsis, unspecified organism: Principal | ICD-10-CM

## 2017-01-18 DIAGNOSIS — Z8711 Personal history of peptic ulcer disease: Secondary | ICD-10-CM

## 2017-01-18 DIAGNOSIS — C7951 Secondary malignant neoplasm of bone: Secondary | ICD-10-CM

## 2017-01-18 DIAGNOSIS — D701 Agranulocytosis secondary to cancer chemotherapy: Secondary | ICD-10-CM

## 2017-01-18 DIAGNOSIS — D696 Thrombocytopenia, unspecified: Secondary | ICD-10-CM

## 2017-01-18 DIAGNOSIS — C3412 Malignant neoplasm of upper lobe, left bronchus or lung: Secondary | ICD-10-CM

## 2017-01-18 DIAGNOSIS — B182 Chronic viral hepatitis C: Secondary | ICD-10-CM

## 2017-01-18 DIAGNOSIS — C7802 Secondary malignant neoplasm of left lung: Secondary | ICD-10-CM

## 2017-01-18 DIAGNOSIS — Z79899 Other long term (current) drug therapy: Secondary | ICD-10-CM

## 2017-01-18 LAB — BASIC METABOLIC PANEL
ANION GAP: 4 — AB (ref 5–15)
BUN: 64 mg/dL — AB (ref 6–20)
CHLORIDE: 104 mmol/L (ref 101–111)
CO2: 20 mmol/L — AB (ref 22–32)
Calcium: 7 mg/dL — ABNORMAL LOW (ref 8.9–10.3)
Creatinine, Ser: 1.62 mg/dL — ABNORMAL HIGH (ref 0.61–1.24)
GFR calc Af Amer: 48 mL/min — ABNORMAL LOW (ref >60)
GFR calc non Af Amer: 42 mL/min — ABNORMAL LOW (ref >60)
GLUCOSE: 148 mg/dL — AB (ref 65–99)
POTASSIUM: 5.1 mmol/L (ref 3.5–5.1)
Sodium: 128 mmol/L — ABNORMAL LOW (ref 135–145)

## 2017-01-18 LAB — ABO/RH: ABO/RH(D): O POS

## 2017-01-18 MED ORDER — HEPARIN SOD (PORK) LOCK FLUSH 100 UNIT/ML IV SOLN
250.0000 [IU] | INTRAVENOUS | Status: DC | PRN
  Filled 2017-01-18: qty 3

## 2017-01-18 MED ORDER — METHYLPREDNISOLONE SODIUM SUCC 40 MG IJ SOLR
40.0000 mg | Freq: Two times a day (BID) | INTRAMUSCULAR | Status: DC
  Administered 2017-01-18 – 2017-01-20 (×5): 40 mg via INTRAVENOUS
  Filled 2017-01-18 (×5): qty 1

## 2017-01-18 MED ORDER — OXYCODONE HCL 5 MG PO TABS
10.0000 mg | ORAL_TABLET | Freq: Three times a day (TID) | ORAL | Status: DC | PRN
  Administered 2017-01-18 – 2017-01-21 (×7): 10 mg via ORAL
  Filled 2017-01-18 (×8): qty 2

## 2017-01-18 MED ORDER — SODIUM CHLORIDE 0.9% FLUSH: 10.0000 mL | INTRAVENOUS | Status: DC | PRN

## 2017-01-18 MED ORDER — SENNOSIDES-DOCUSATE SODIUM 8.6-50 MG PO TABS
1.0000 | ORAL_TABLET | Freq: Two times a day (BID) | ORAL | Status: DC
  Administered 2017-01-18 – 2017-01-20 (×6): 1 via ORAL
  Filled 2017-01-18 (×7): qty 1

## 2017-01-18 MED ORDER — OXYCODONE HCL 5 MG PO TABS
5.0000 mg | ORAL_TABLET | ORAL | Status: DC | PRN
  Administered 2017-01-18: 10 mg via ORAL
  Filled 2017-01-18: qty 2

## 2017-01-18 MED ORDER — MORPHINE SULFATE (PF) 4 MG/ML IV SOLN
2.0000 mg | INTRAVENOUS | Status: DC | PRN
  Administered 2017-01-18 – 2017-01-19 (×6): 2 mg via INTRAVENOUS
  Filled 2017-01-18 (×5): qty 1

## 2017-01-18 MED ORDER — ACETAMINOPHEN 325 MG PO TABS
650.0000 mg | ORAL_TABLET | Freq: Once | ORAL | Status: AC
  Administered 2017-01-18: 650 mg via ORAL
  Filled 2017-01-18: qty 2

## 2017-01-18 MED ORDER — METHADONE HCL 10 MG PO TABS: 40.0000 mg | ORAL_TABLET | Freq: Every day | ORAL | Status: DC

## 2017-01-18 MED ORDER — HEPARIN SOD (PORK) LOCK FLUSH 100 UNIT/ML IV SOLN
500.0000 [IU] | Freq: Every day | INTRAVENOUS | Status: DC | PRN
  Filled 2017-01-18: qty 5

## 2017-01-18 MED ORDER — MORPHINE SULFATE (PF) 4 MG/ML IV SOLN
2.0000 mg | INTRAVENOUS | Status: AC
Start: 2017-01-18 — End: 2017-01-18
  Administered 2017-01-18: 2 mg via INTRAVENOUS
  Filled 2017-01-18: qty 1

## 2017-01-18 MED ORDER — SODIUM CHLORIDE 0.9 % IV SOLN
250.0000 mL | Freq: Once | INTRAVENOUS | Status: AC
  Administered 2017-01-18: 250 mL via INTRAVENOUS

## 2017-01-18 MED ORDER — IPRATROPIUM-ALBUTEROL 0.5-2.5 (3) MG/3ML IN SOLN
3.0000 mL | RESPIRATORY_TRACT | Status: DC
Start: 2017-01-18 — End: 2017-01-19
  Administered 2017-01-18 – 2017-01-19 (×8): 3 mL via RESPIRATORY_TRACT
  Filled 2017-01-18 (×9): qty 3

## 2017-01-18 MED ORDER — SODIUM CHLORIDE 0.9% FLUSH: 3.0000 mL | INTRAVENOUS | Status: DC | PRN

## 2017-01-18 MED ORDER — TBO-FILGRASTIM 480 MCG/0.8ML ~~LOC~~ SOSY
480.0000 ug | PREFILLED_SYRINGE | Freq: Every day | SUBCUTANEOUS | Status: DC
  Administered 2017-01-18 – 2017-01-20 (×3): 480 ug via SUBCUTANEOUS
  Filled 2017-01-18 (×4): qty 0.8

## 2017-01-18 MED ORDER — BUDESONIDE 0.5 MG/2ML IN SUSP
0.5000 mg | Freq: Two times a day (BID) | RESPIRATORY_TRACT | Status: DC
  Administered 2017-01-18 – 2017-01-21 (×7): 0.5 mg via RESPIRATORY_TRACT
  Filled 2017-01-18 (×7): qty 2

## 2017-01-18 MED ORDER — METHADONE HCL 10 MG PO TABS
40.0000 mg | ORAL_TABLET | Freq: Every day | ORAL | Status: DC
  Administered 2017-01-18 – 2017-01-20 (×3): 40 mg via ORAL
  Filled 2017-01-18 (×6): qty 4

## 2017-01-18 MED ORDER — METHADONE HCL 10 MG PO TABS: 20.0000 mg | ORAL_TABLET | Freq: Every day | ORAL | Status: DC

## 2017-01-18 NOTE — Progress Notes (Signed)
Transferred to 1-C.  Report called to [facility].  CCMD informed.  Pt left floor via bed with all belongings in tow.

## 2017-01-18 NOTE — Progress Notes (Addendum)
Pharmacy Antibiotic Note  [name] is a 71 y.o. male admitted on 01/21/2017 with HCAP.  Pharmacy has been consulted for cefepime and vancomycin dosing. Patient diagnosed with stage IV lung cancer.   Plan: After discussion with Dr. Mortimer Fries, will continue vancomycin despite negative MRSA PCR.   Will continue vancomycin '750mg'$  Q24hr per stacked protocol. Goal trough 15-20 mcg/mL. Draw trough prior to the fifth dose.   Cefepime 2 g Q12h (CrCl 38.67m/min)  Height: '5\' 9"'$  (175.3 cm) Weight: 162 lb 4.1 oz (73.6 kg) IBW/kg (Calculated) : 70.7  Temp (24hrs), Avg:98.8 F (37.1 C), Min:97.8 F (36.6 C), Max:102.2 F (39 C)   Recent Labs Lab 02/08/2017 1726 01/15/2017 1729 02/03/2017 1800 01/10/2017 2000 01/18/17 0352  WBC  --   --  0.3*  --   --   CREATININE 1.76*  --   --   --  1.62*  LATICACIDVEN  --  2.4*  --  0.9  --     Estimated Creatinine Clearance: 43 mL/min (A) (by C-G formula based on SCr of 1.62 mg/dL (H)).    Allergies  Allergen Reactions  . Iodine Hives    Antimicrobials this admission: Cefepime 5/9 >> Vancomycin 5/9>>  Microbiology results: 5/9 BCx: NGTD 5/9 UCx: sent SCx: sent 4/16 MRSA PCR: negative  Thank you for allowing pharmacy to be a part of this patient's care.  CNapoleon Form PharmD 01/18/2017 3:28 PM
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
## 2017-01-18 NOTE — Progress Notes (Signed)
Dr. Mortimer Fries gave order for 2 mg of morphine because patient complaining of pain at this time.

## 2017-01-18 NOTE — Progress Notes (Signed)
Pharmacy Consult for Constipation Prevention Indication: Opioid use  Allergies  Allergen Reactions  . Iodine Hives    Patient Measurements: Height: '5\' 9"'$  (175.3 cm) Weight: 162 lb 4.1 oz (73.6 kg) IBW/kg (Calculated) : 70.7   Vital Signs: Temp: 98 F (36.7 C) (05/10 1318) Temp Source: Oral (05/10 1318) BP: 117/51 (05/10 1400) Pulse Rate: 91 (05/10 1400) Intake/Output from previous day: 05/09 0701 - 05/10 0700 In: 380 [P.O.:180; I.V.:150; IV Piggyback:50] Out: 600 [Urine:600] Intake/Output from this shift: Total I/O In: 735 [I.V.:735] Out: 250 [Urine:250]  Labs:  Recent Labs  02/04/2017 1726 01/25/2017 1800 01/18/17 0352  WBC  --  0.3*  --   HGB  --  9.1*  --   HCT  --  27.0*  --   PLT  --  15*  --   CREATININE 1.76*  --  1.62*  ALBUMIN 2.8*  --   --   PROT 5.5*  --   --   AST 40  --   --   ALT 64*  --   --   ALKPHOS 90  --   --   BILITOT 5.7*  --   --    Estimated Creatinine Clearance: 43 mL/min (A) (by C-G formula based on SCr of 1.62 mg/dL (H)).   Microbiology: Recent Results (from the past 720 hour(s))  Surgical PCR screen     Status: None   Collection Time: 12/25/16  8:15 PM  Result Value Ref Range Status   MRSA, PCR NEGATIVE NEGATIVE Final   Staphylococcus aureus NEGATIVE NEGATIVE Final    Comment:        The Xpert SA Assay (FDA approved for NASAL specimens in patients over 56 years of age), is one component of a comprehensive surveillance program.  Test performance has been validated by [facility] for patients greater than or equal to 65 year old. It is not intended to diagnose infection nor to guide or monitor treatment.   Blood Culture (routine x 2)     Status: None (Preliminary result)   Collection Time: 01/21/2017  5:27 PM  Result Value Ref Range Status   Specimen Description BLOOD LEFT HAND  Final   Special Requests   Final    BOTTLES DRAWN AEROBIC AND ANAEROBIC Blood Culture adequate volume   Culture NO GROWTH < 24 HOURS  Final   Report Status PENDING  Incomplete  Blood Culture (routine x 2)     Status: None (Preliminary result)   Collection Time: 01/14/2017  5:29 PM  Result Value Ref Range Status   Specimen Description BLOOD LEFT ANTECUBITAL  Final   Special Requests   Final    BOTTLES DRAWN AEROBIC AND ANAEROBIC Blood Culture adequate volume   Culture NO GROWTH < 24 HOURS  Final   Report Status PENDING  Incomplete  MRSA PCR Screening     Status: None   Collection Time: 01/18/2017 10:25 PM  Result Value Ref Range Status   MRSA by PCR NEGATIVE NEGATIVE Final    Comment:        The GeneXpert MRSA Assay (FDA approved for NASAL specimens only), is one component of a comprehensive MRSA colonization surveillance program. It is not intended to diagnose MRSA infection nor to guide or monitor treatment for MRSA infections.     Medical History: Past Medical History:  Diagnosis Date  . Benign non-nodular prostatic hyperplasia with lower urinary tract symptoms   . Bladder cancer ([place])   . Chronic hepatitis C without hepatic coma ([place])   .  Cirrhosis of liver ([place])   . PUD (peptic ulcer disease)   . Rotator cuff tendinitis, right   . Spondylosis of cervical region without myelopathy or radiculopathy   . Tobacco abuse     Assessment: 71 y/o M with a h/o chronic pain and lung CA admitted with PNA/sepsis. Patient is on methadone, Fentanyl patch, and oxycodone.   Plan:  Will begin senna/docusate bid and continue to follow.   Ulice Dash D 01/18/2017,3:38 PM

## 2017-01-18 NOTE — Progress Notes (Signed)
Sitting up on side of bed watching tv.  Pain controlled with PRN pain meds during shift.  Tolerated unit of platelets well.  NSR per cardiac monitor.  VSS.  Patient for 1C care waiting for bed.

## 2017-01-18 NOTE — Progress Notes (Signed)
During rounds Dr. Mortimer Fries stated that he would discuss with Nicki Reaper from pharmacy patient's methadone and MD gave order for PRN morphine 2 mg, q2H for pain.

## 2017-01-19 ENCOUNTER — Ambulatory Visit: Payer: Medicare Other

## 2017-01-19 LAB — CBC WITH DIFFERENTIAL/PLATELET
Basophils Absolute: 0 10*3/uL (ref 0–0.1)
Basophils Relative: 1 %
EOS PCT: 1 %
Eosinophils Absolute: 0 10*3/uL (ref 0–0.7)
HCT: 29.3 % — ABNORMAL LOW (ref 40.0–52.0)
Hemoglobin: 10 g/dL — ABNORMAL LOW (ref 13.0–18.0)
LYMPHS ABS: 0 10*3/uL — AB (ref 1.0–3.6)
Lymphocytes Relative: 7 %
MCH: 32.7 pg (ref 26.0–34.0)
MCHC: 34.2 g/dL (ref 32.0–36.0)
MCV: 95.7 fL (ref 80.0–100.0)
MONO ABS: 0 10*3/uL — AB (ref 0.2–1.0)
Monocytes Relative: 4 %
NEUTROS ABS: 0.6 10*3/uL — AB (ref 1.4–6.5)
Neutrophils Relative %: 87 %
PLATELETS: 9 10*3/uL — AB (ref 150–440)
RBC: 3.06 MIL/uL — ABNORMAL LOW (ref 4.40–5.90)
RDW: 13.6 % (ref 11.5–14.5)
WBC: 0.6 10*3/uL — CL (ref 3.8–10.6)

## 2017-01-19 LAB — CBC
HCT: 29 % — ABNORMAL LOW (ref 40.0–52.0)
HEMOGLOBIN: 9.8 g/dL — AB (ref 13.0–18.0)
MCH: 32.6 pg (ref 26.0–34.0)
MCHC: 33.6 g/dL (ref 32.0–36.0)
MCV: 96.8 fL (ref 80.0–100.0)
PLATELETS: 14 10*3/uL — AB (ref 150–440)
RBC: 3 MIL/uL — AB (ref 4.40–5.90)
RDW: 14.1 % (ref 11.5–14.5)
WBC: 0.8 10*3/uL — CL (ref 3.8–10.6)

## 2017-01-19 LAB — BPAM PLATELET PHERESIS
BLOOD PRODUCT EXPIRATION DATE: 201805112359
ISSUE DATE / TIME: 201805101247
Unit Type and Rh: 6200

## 2017-01-19 LAB — PREPARE PLATELET PHERESIS: Unit division: 0

## 2017-01-19 LAB — BASIC METABOLIC PANEL
Anion gap: 5 (ref 5–15)
BUN: 53 mg/dL — AB (ref 6–20)
CALCIUM: 7.1 mg/dL — AB (ref 8.9–10.3)
CO2: 20 mmol/L — ABNORMAL LOW (ref 22–32)
CREATININE: 1.21 mg/dL (ref 0.61–1.24)
Chloride: 104 mmol/L (ref 101–111)
GFR calc Af Amer: >60 mL/min (ref >60)
GFR, EST NON AFRICAN AMERICAN: 59 mL/min — AB (ref >60)
GLUCOSE: 255 mg/dL — AB (ref 65–99)
POTASSIUM: 4.9 mmol/L (ref 3.5–5.1)
SODIUM: 129 mmol/L — AB (ref 135–145)

## 2017-01-19 LAB — GLUCOSE, CAPILLARY: Glucose-Capillary: 95 mg/dL (ref 65–99)

## 2017-01-19 LAB — URINE CULTURE: Culture: NO GROWTH

## 2017-01-19 MED ORDER — ACETAMINOPHEN 325 MG PO TABS
650.0000 mg | ORAL_TABLET | Freq: Once | ORAL | Status: AC
Start: 2017-01-19 — End: 2017-01-19
  Administered 2017-01-19: 650 mg via ORAL
  Filled 2017-01-19: qty 2

## 2017-01-19 MED ORDER — MORPHINE SULFATE (PF) 4 MG/ML IV SOLN
2.0000 mg | INTRAVENOUS | Status: DC | PRN
  Administered 2017-01-19 – 2017-01-20 (×6): 2 mg via INTRAVENOUS
  Filled 2017-01-19 (×6): qty 1

## 2017-01-19 MED ORDER — SODIUM CHLORIDE 0.9% FLUSH: 10.0000 mL | INTRAVENOUS | Status: DC | PRN

## 2017-01-19 MED ORDER — VANCOMYCIN HCL IN DEXTROSE 750-5 MG/150ML-% IV SOLN: 750.0000 mg | Freq: Two times a day (BID) | INTRAVENOUS | Status: DC

## 2017-01-19 MED ORDER — HEPARIN SOD (PORK) LOCK FLUSH 100 UNIT/ML IV SOLN: 500.0000 [IU] | Freq: Every day | INTRAVENOUS | Status: DC | PRN

## 2017-01-19 MED ORDER — VANCOMYCIN HCL IN DEXTROSE 1-5 GM/200ML-% IV SOLN
1000.0000 mg | INTRAVENOUS | Status: DC
  Administered 2017-01-19 – 2017-01-21 (×3): 1000 mg via INTRAVENOUS
  Filled 2017-01-19 (×4): qty 200

## 2017-01-19 MED ORDER — HEPARIN SOD (PORK) LOCK FLUSH 100 UNIT/ML IV SOLN: 250.0000 [IU] | INTRAVENOUS | Status: DC | PRN

## 2017-01-19 MED ORDER — SODIUM CHLORIDE 0.9% FLUSH: 3.0000 mL | INTRAVENOUS | Status: DC | PRN

## 2017-01-19 MED ORDER — IPRATROPIUM-ALBUTEROL 0.5-2.5 (3) MG/3ML IN SOLN
3.0000 mL | Freq: Three times a day (TID) | RESPIRATORY_TRACT | Status: DC
  Administered 2017-01-19 – 2017-01-21 (×5): 3 mL via RESPIRATORY_TRACT
  Filled 2017-01-19 (×5): qty 3

## 2017-01-19 MED ORDER — SODIUM CHLORIDE 0.9 % IV SOLN
250.0000 mL | Freq: Once | INTRAVENOUS | Status: AC
  Administered 2017-01-19: 250 mL via INTRAVENOUS
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
## 2017-01-19 NOTE — Progress Notes (Signed)
Inpatient Diabetes Program Recommendations  AACE/ADA: New Consensus Statement on Inpatient Glycemic Control (2015)  Target Ranges:  Prepandial:   less than 140 mg/dL      Peak postprandial:   less than 180 mg/dL (1-2 hours)      Critically ill patients:  140 - 180 mg/dL   Results for DAYVION, SANS (MRN [id number]) as of 01/19/2017 09:29  Ref. Range 02/06/2017 17:26 01/18/2017 03:52 01/19/2017 03:52  Glucose Latest Ref Range: 65 - 99 mg/dL 66 148 (H) 255 (H)   Review of Glycemic Control  Diabetes history: No Outpatient Diabetes medications: NA Current orders for Inpatient glycemic control: None  Inpatient Diabetes Program Recommendations: Correction (SSI): While inpatient and ordered steroids, please consider ordering CBGs with Novolog 0-9 units TID with meals and Novolog 0-5 units QHS.  Thanks, Barnie Alderman, RN, MSN, CDE Diabetes Coordinator Inpatient Diabetes Program [phone] (Team Pager from 8am to 5pm)

## 2017-01-19 NOTE — Care Management Note (Signed)
Case Management Note  Patient Details  Name: [name] MRN: [id number] Date of Birth: 03/22/1946  Subjective/Objective:    Admitted to [facility] with the diagnosis of sepsis. Lives with daughter, Syliva Overman [phone]). Last seen Dr. Baldemar Lenis 2 weeks ago. Goes to the [facility] for lung cancer needs. Stopped chemotherapy. Prescriptions are filled at [facility] on [address]. No home health. No skilled facility. No home oxygen. Raised toilet seat, shower chair, rolling walker, 2 canes and a wheelchair in the home. Takes care of all basic activities of daily living himself, drives. No falls. Good appetite. Daughter will transport.                   Action/Plan:  Will continue to follow for discharge needs   Expected Discharge Date:                  Expected Discharge Plan:     In-House Referral:     Discharge planning Services     Post Acute Care Choice:    Choice offered to:     DME Arranged:    DME Agency:     HH Arranged:    HH Agency:     Status of Service:     If discussed at [employer] of Stay Meetings, dates discussed:    Additional Comments:  Shelbie Ammons, RN MSN CCM Care Management [phone] 01/19/2017, 12:15 PM

## 2017-01-19 NOTE — Progress Notes (Signed)
Pharmacy Antibiotic Note  [name] is a 71 y.o. male admitted on 02/02/2017 with HCAP.  Pharmacy has been consulted for cefepime and vancomycin dosing. Patient diagnosed with stage IV lung cancer.   Plan: Given improvement in renal function, will change vancomycin dose to 1000 mg IV q18h.  Goal VT 15-20 mcg/mL Will check VT on 5/13 at 2130 which is prior to 5th dose on regimen.  Pharmacy to follow renal function and check earlier if warranted.  Kinetics: CrCl 58 mL/min, actual body weight = 74 kg ke: 0.054 Half-life: 13 hours Vd: 52 L Cmin (estimated) ~15 mcg/mL  Continue cefepime 2 g Q12h (CrCl 57 mL/min)   Height: '5\' 9"'$  (007.6 cm) Weight: 162 lb 4.1 oz (73.6 kg) IBW/kg (Calculated) : 70.7  Temp (24hrs), Avg:97.8 F (36.6 C), Min:97.5 F (36.4 C), Max:98.1 F (36.7 C)   Recent Labs Lab 01/26/2017 1726 01/20/2017 1729 01/15/2017 1800 01/09/2017 2000 01/18/17 0352 01/19/17 0352  WBC  --   --  0.3*  --   --  0.6*  CREATININE 1.76*  --   --   --  1.62* 1.21  LATICACIDVEN  --  2.4*  --  0.9  --   --     Estimated Creatinine Clearance: 57.6 mL/min (by C-G formula based on SCr of 1.21 mg/dL).    Allergies  Allergen Reactions  . Iodine Hives    Antimicrobials this admission: Cefepime 5/9 >> Vancomycin 5/9>>  Microbiology results: 5/9 BCx: No growth 2 days 5/9 UCx: sent SCx: sent 4/16 MRSA PCR: negative  Thank you for allowing pharmacy to be a part of this patient's care.  Lenis Noon, PharmD Clinical Pharmacist 01/19/2017 8:37 AM
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
## 2017-01-20 LAB — CBC WITH DIFFERENTIAL/PLATELET
BASOS ABS: 0 10*3/uL (ref 0–0.1)
BASOS PCT: 0 %
EOS ABS: 0 10*3/uL (ref 0–0.7)
Eosinophils Relative: 1 %
HCT: 31.1 % — ABNORMAL LOW (ref 40.0–52.0)
Hemoglobin: 10.6 g/dL — ABNORMAL LOW (ref 13.0–18.0)
Lymphocytes Relative: 6 %
Lymphs Abs: 0.1 10*3/uL — ABNORMAL LOW (ref 1.0–3.6)
MCH: 32.4 pg (ref 26.0–34.0)
MCHC: 33.9 g/dL (ref 32.0–36.0)
MCV: 95.3 fL (ref 80.0–100.0)
MONO ABS: 0 10*3/uL — AB (ref 0.2–1.0)
Monocytes Relative: 4 %
Neutro Abs: 0.9 10*3/uL — ABNORMAL LOW (ref 1.4–6.5)
Neutrophils Relative %: 89 %
PLATELETS: 12 10*3/uL — AB (ref 150–440)
RBC: 3.26 MIL/uL — ABNORMAL LOW (ref 4.40–5.90)
RDW: 14.2 % (ref 11.5–14.5)
WBC: 1 10*3/uL — CL (ref 3.8–10.6)

## 2017-01-20 LAB — BPAM PLATELET PHERESIS
Blood Product Expiration Date: 201805132359
ISSUE DATE / TIME: 201805111053
UNIT TYPE AND RH: 5100

## 2017-01-20 LAB — BASIC METABOLIC PANEL
ANION GAP: 6 (ref 5–15)
BUN: 57 mg/dL — ABNORMAL HIGH (ref 6–20)
CALCIUM: 7.7 mg/dL — AB (ref 8.9–10.3)
CO2: 21 mmol/L — ABNORMAL LOW (ref 22–32)
Chloride: 103 mmol/L (ref 101–111)
Creatinine, Ser: 1.28 mg/dL — ABNORMAL HIGH (ref 0.61–1.24)
GFR, EST NON AFRICAN AMERICAN: 55 mL/min — AB (ref >60)
GLUCOSE: 180 mg/dL — AB (ref 65–99)
Potassium: 5.4 mmol/L — ABNORMAL HIGH (ref 3.5–5.1)
SODIUM: 130 mmol/L — AB (ref 135–145)

## 2017-01-20 LAB — PREPARE PLATELET PHERESIS: Unit division: 0

## 2017-01-20 MED ORDER — POLYETHYLENE GLYCOL 3350 17 G PO PACK
17.0000 g | PACK | Freq: Every day | ORAL | Status: DC
  Administered 2017-01-20: 22:00:00 17 g via ORAL
  Filled 2017-01-20: qty 1

## 2017-01-20 MED ORDER — PANTOPRAZOLE SODIUM 40 MG PO TBEC
40.0000 mg | DELAYED_RELEASE_TABLET | Freq: Every day | ORAL | Status: DC
  Administered 2017-01-20: 13:00:00 40 mg via ORAL
  Filled 2017-01-20 (×2): qty 1

## 2017-01-20 MED ORDER — BENZOCAINE 10 % MT GEL
Freq: Three times a day (TID) | OROMUCOSAL | Status: DC | PRN
  Filled 2017-01-20: qty 9.4

## 2017-01-20 MED ORDER — BISACODYL 10 MG RE SUPP: 10.0000 mg | Freq: Every day | RECTAL | Status: DC | PRN

## 2017-01-20 MED ORDER — LORAZEPAM 0.5 MG PO TABS: 0.5000 mg | ORAL_TABLET | Freq: Four times a day (QID) | ORAL | Status: DC | PRN

## 2017-01-20 MED ORDER — METHYLPREDNISOLONE SODIUM SUCC 40 MG IJ SOLR
40.0000 mg | Freq: Three times a day (TID) | INTRAMUSCULAR | Status: DC
  Administered 2017-01-20 – 2017-01-21 (×3): 40 mg via INTRAVENOUS
  Filled 2017-01-20 (×3): qty 1

## 2017-01-20 MED ORDER — SODIUM POLYSTYRENE SULFONATE 15 GM/60ML PO SUSP
30.0000 g | Freq: Once | ORAL | Status: AC
  Administered 2017-01-20: 30 g via ORAL
  Filled 2017-01-20: qty 120

## 2017-01-20 MED ORDER — MORPHINE SULFATE (PF) 2 MG/ML IV SOLN
2.0000 mg | INTRAVENOUS | Status: DC | PRN
  Administered 2017-01-20 – 2017-01-21 (×5): 2 mg via INTRAVENOUS
  Filled 2017-01-20 (×5): qty 1
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
## 2017-01-21 LAB — BASIC METABOLIC PANEL
ANION GAP: 6 (ref 5–15)
BUN: 58 mg/dL — ABNORMAL HIGH (ref 6–20)
CHLORIDE: 107 mmol/L (ref 101–111)
CO2: 19 mmol/L — AB (ref 22–32)
Calcium: 7.9 mg/dL — ABNORMAL LOW (ref 8.9–10.3)
Creatinine, Ser: 1.22 mg/dL (ref 0.61–1.24)
GFR calc non Af Amer: 59 mL/min — ABNORMAL LOW (ref >60)
Glucose, Bld: 196 mg/dL — ABNORMAL HIGH (ref 65–99)
POTASSIUM: 5.3 mmol/L — AB (ref 3.5–5.1)
Sodium: 132 mmol/L — ABNORMAL LOW (ref 135–145)

## 2017-01-21 LAB — CBC WITH DIFFERENTIAL/PLATELET
BASOS PCT: 0 %
Basophils Absolute: 0 10*3/uL (ref 0–0.1)
Eosinophils Absolute: 0 10*3/uL (ref 0–0.7)
Eosinophils Relative: 0 %
HEMATOCRIT: 30.6 % — AB (ref 40.0–52.0)
HEMOGLOBIN: 10.6 g/dL — AB (ref 13.0–18.0)
Lymphocytes Relative: 9 %
Lymphs Abs: 0.1 10*3/uL — ABNORMAL LOW (ref 1.0–3.6)
MCH: 32.7 pg (ref 26.0–34.0)
MCHC: 34.5 g/dL (ref 32.0–36.0)
MCV: 94.7 fL (ref 80.0–100.0)
Monocytes Absolute: 0 10*3/uL — ABNORMAL LOW (ref 0.2–1.0)
Monocytes Relative: 3 %
NEUTROS ABS: 1.1 10*3/uL — AB (ref 1.4–6.5)
NEUTROS PCT: 88 %
Platelets: 14 10*3/uL — CL (ref 150–440)
RBC: 3.24 MIL/uL — AB (ref 4.40–5.90)
RDW: 14.2 % (ref 11.5–14.5)
WBC: 1.3 10*3/uL — CL (ref 3.8–10.6)

## 2017-01-21 MED ORDER — MORPHINE 100MG IN NS 100ML (1MG/ML) PREMIX INFUSION
7.0000 mg/h | INTRAVENOUS | Status: DC
  Administered 2017-01-21: 5 mg/h via INTRAVENOUS
  Filled 2017-01-21: qty 100

## 2017-01-21 MED ORDER — SODIUM CHLORIDE 0.9 % IV SOLN
5.0000 mg/h | INTRAVENOUS | Status: DC
  Filled 2017-01-21: qty 10

## 2017-01-21 MED ORDER — ONDANSETRON HCL 4 MG PO TABS: 4.0000 mg | ORAL_TABLET | Freq: Three times a day (TID) | ORAL | Status: DC | PRN

## 2017-01-21 MED ORDER — LORAZEPAM 2 MG/ML IJ SOLN
1.0000 mg | INTRAMUSCULAR | Status: DC | PRN
  Administered 2017-01-21 – 2017-01-22 (×4): 1 mg via INTRAVENOUS
  Filled 2017-01-21 (×4): qty 1

## 2017-01-21 MED ORDER — ONDANSETRON HCL 4 MG/2ML IJ SOLN
4.0000 mg | Freq: Four times a day (QID) | INTRAMUSCULAR | Status: DC | PRN
Start: 2017-01-21 — End: 2017-01-23

## 2017-01-21 MED ORDER — MORPHINE SULFATE (PF) 2 MG/ML IV SOLN
2.0000 mg | INTRAVENOUS | Status: DC | PRN
  Administered 2017-01-21 – 2017-01-22 (×6): 2 mg via INTRAVENOUS
  Filled 2017-01-21 (×6): qty 1

## 2017-01-21 MED ORDER — SODIUM POLYSTYRENE SULFONATE 15 GM/60ML PO SUSP: 15.0000 g | Freq: Once | ORAL | Status: DC

## 2017-01-21 NOTE — Progress Notes (Signed)
Patient ID: [name], male   DOB: 1946-01-13, 71 y.o.   MRN: [id number] Patient requiring '2mg'$  q1hour IV morphine.  Will start IV morphine drip at '5mg'$ /hr and titrate up as needed.  Will maintain '2mg'$  q1h dose for breakthrough until pain is well controlled.
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
## 2017-01-22 ENCOUNTER — Ambulatory Visit: Payer: Medicare Other

## 2017-01-22 ENCOUNTER — Inpatient Hospital Stay: Payer: Medicare Other

## 2017-01-22 DIAGNOSIS — Z7189 Other specified counseling: Secondary | ICD-10-CM

## 2017-01-22 DIAGNOSIS — J189 Pneumonia, unspecified organism: Secondary | ICD-10-CM

## 2017-01-22 DIAGNOSIS — Z515 Encounter for palliative care: Secondary | ICD-10-CM

## 2017-01-22 LAB — CULTURE, BLOOD (ROUTINE X 2)
Culture: NO GROWTH
Culture: NO GROWTH
SPECIAL REQUESTS: ADEQUATE
Special Requests: ADEQUATE

## 2017-01-22 MED ORDER — HALOPERIDOL LACTATE 2 MG/ML PO CONC: 0.5000 mg | ORAL | Status: DC | PRN

## 2017-01-22 MED ORDER — GLYCOPYRROLATE 0.2 MG/ML IJ SOLN
0.2000 mg | INTRAMUSCULAR | Status: DC | PRN
  Administered 2017-01-23: 01:00:00 0.2 mg via INTRAVENOUS
  Filled 2017-01-22: qty 1

## 2017-01-22 MED ORDER — TROLAMINE SALICYLATE 10 % EX CREA
TOPICAL_CREAM | Freq: Two times a day (BID) | CUTANEOUS | Status: DC
  Administered 2017-01-22 (×2): via TOPICAL
  Filled 2017-01-22: qty 85

## 2017-01-22 MED ORDER — POLYVINYL ALCOHOL 1.4 % OP SOLN: 1.0000 [drp] | Freq: Four times a day (QID) | OPHTHALMIC | Status: DC | PRN

## 2017-01-22 MED ORDER — HALOPERIDOL 0.5 MG PO TABS: 0.5000 mg | ORAL_TABLET | ORAL | Status: DC | PRN

## 2017-01-22 MED ORDER — SODIUM CHLORIDE 0.9 % IV SOLN
2.0000 mg/h | INTRAVENOUS | Status: DC
  Administered 2017-01-22: 22:00:00 4 mg/h via INTRAVENOUS
  Administered 2017-01-22: 3 mg/h via INTRAVENOUS
  Administered 2017-01-22: 13:00:00 2 mg/h via INTRAVENOUS
  Administered 2017-01-23: 6 mg/h via INTRAVENOUS
  Administered 2017-01-23: 10 mg/h via INTRAVENOUS
  Filled 2017-01-22 (×3): qty 2.5

## 2017-01-22 MED ORDER — DEXTROSE 5 % IV SOLN
500.0000 mg | Freq: Four times a day (QID) | INTRAVENOUS | Status: DC | PRN
  Filled 2017-01-22: qty 5

## 2017-01-22 MED ORDER — GLYCOPYRROLATE 0.2 MG/ML IJ SOLN: 0.2000 mg | INTRAMUSCULAR | Status: DC | PRN

## 2017-01-22 MED ORDER — HALOPERIDOL LACTATE 5 MG/ML IJ SOLN
0.5000 mg | INTRAMUSCULAR | Status: DC | PRN
  Administered 2017-01-22: 0.5 mg via INTRAVENOUS
  Filled 2017-01-22: qty 1

## 2017-01-22 MED ORDER — GLYCOPYRROLATE 1 MG PO TABS: 1.0000 mg | ORAL_TABLET | ORAL | Status: DC | PRN

## 2017-01-22 MED ORDER — HYDROMORPHONE BOLUS VIA INFUSION
1.0000 mg | INTRAVENOUS | Status: DC | PRN
  Administered 2017-01-22 (×2): 4 mg via INTRAVENOUS
  Filled 2017-01-22: qty 4

## 2017-01-22 MED ORDER — MORPHINE SULFATE (PF) 2 MG/ML IV SOLN: 2.0000 mg | INTRAVENOUS | Status: DC | PRN

## 2017-01-22 MED ORDER — BIOTENE DRY MOUTH MT LIQD: 15.0000 mL | OROMUCOSAL | Status: DC | PRN

## 2017-01-22 MED ORDER — LORAZEPAM 2 MG/ML IJ SOLN
1.0000 mg | Freq: Four times a day (QID) | INTRAMUSCULAR | Status: DC
  Administered 2017-01-22 – 2017-01-23 (×3): 1 mg via INTRAVENOUS
  Filled 2017-01-22 (×3): qty 1

## 2017-01-22 NOTE — Progress Notes (Signed)
Patient was at end of life. Chaplain provided care and prayer to patient and his family members.   01/22/17 1200  Clinical Encounter Type  Visited With Patient and family together  Visit Type Initial  Referral From Physician  Consult/Referral To Chaplain  Spiritual Encounters  Spiritual Needs Prayer

## 2017-01-22 NOTE — Consult Note (Signed)
Consultation Note Date: 01/22/2017   Patient Name: Makel Mcmann  DOB: 1946/01/21  MRN: [id number]  Age / Sex: 71 y.o., male  PCP: Derinda Late, MD Referring Physician: [name], MD  Reason for Consultation: Establishing goals of care, Hospice Evaluation, Pain control and Psychosocial/spiritual support  HPI/Patient Profile: 71 y.o. male  with past medical history of hep C, cirrhosis, bladder cancer, chronic pain on methadone, and lung cancer with mets to the bone who was admitted on 01/12/2017 with shortness of breath, fever and sepsis. He had his first chemotherapy last week.  He has developed acute renal failure and is pancytopenic with platelets of 14.   Currently his is somnolent, not able to take any oral medication or nutrition.  Clinical Assessment and Goals of Care:  I have reviewed medical records including EPIC notes, labs and imaging, received report from Dr. Margaretmary Eddy, assessed the patient and then met at the bedside along with his daughters and neice  to discuss diagnosis prognosis, GOC, EOL wishes, disposition and options.  I introduced Palliative Medicine as specialized medical care for people living with serious illness. It focuses on providing relief from the symptoms and stress of a serious illness. The goal is to improve quality of life for both the patient and the family.  We discussed a brief life review of the patient.  Then I assessed functional and nutritional status at home by gathering history. We discussed their current illness and what it means in the larger context of their on-going co-morbidities.  Natural disease trajectory and expectations at EOL were discussed.  The family understands that Mr. Steege is near end of life.  They say they have discussed this with him and he understands it as well.  Their primary concern is his comfort.  We discussed hospice services.  They are  trying to decide between taking him home with hospice vs Hospice House.  Questions and concerns were addressed.  Hard Choices booklet left for review. The family was encouraged to call with questions or concerns.  PMT will continue to support holistically.   Primary Decision Maker:  NEXT OF KIN, Daughter    SUMMARY OF RECOMMENDATIONS     Full comfort Care.  Hydromorphone gtt with PRN boluses for shortness of breath and pain  Patient will likely require a higher amount of opiate medication than "usual" as he is opioid tolerant and dependent.  We will change him from morphine to dilaudid.  Robinul for secretions.   Code Status/Advance Care Planning:  DNR    Psycho-social/Spiritual:   Desire for further Chaplaincy support: yes [facility]   Prognosis:   Hours - Days  Discharge Planning: Hospice facility most likely      Primary Diagnoses: Present on Admission: . Sepsis ([place])   I have reviewed the medical record, interviewed the patient and family, and examined the patient. The following aspects are pertinent.  Past Medical History:  Diagnosis Date  . Benign non-nodular prostatic hyperplasia with lower urinary tract symptoms   . Bladder cancer ([place])   .  Chronic hepatitis C without hepatic coma ([place])   . Cirrhosis of liver ([place])   . PUD (peptic ulcer disease)   . Rotator cuff tendinitis, right   . Spondylosis of cervical region without myelopathy or radiculopathy   . Tobacco abuse    Social History   Social History  . Marital status: Widowed    Spouse name: N/A  . Number of children: N/A  . Years of education: N/A   Social History Main Topics  . Smoking status: Former Smoker    Packs/day: 1.00    Years: 49.00    Types: Cigarettes  . Smokeless tobacco: Never Used  . Alcohol use No  . Drug use: No  . Sexual activity: Not Currently   Other Topics Concern  . None   Social History Narrative  . None   Family History  Problem Relation Age of Onset    . Lung cancer Father   . Diabetes type II Maternal Grandmother   . Lung cancer Mother   . Mental illness Brother   . Other Brother        h/o substance abuse   Scheduled Meds: . fentaNYL  100 mcg Transdermal Q72H   Continuous Infusions: . methocarbamol (ROBAXIN)  IV    . morphine 7 mg/hr (01/22/17 1010)   PRN Meds:.acetaminophen **OR** acetaminophen, antiseptic oral rinse, benzocaine, glycopyrrolate **OR** glycopyrrolate **OR** glycopyrrolate, haloperidol **OR** haloperidol **OR** haloperidol lactate, LORazepam, methocarbamol (ROBAXIN)  IV, morphine injection, ondansetron (ZOFRAN) IV, ondansetron, oxyCODONE, polyvinyl alcohol, prochlorperazine Allergies  Allergen Reactions  . Iodine Hives   Review of Systems patient is not responsive to voice or light touch  Physical Exam  Well developed chronically ill appearing male,  not responsive to voice or light touch CV tachy resp no distress.  Sounds wet Abdomen soft Extremities 2-3+ edema  Vital Signs: BP 114/64 (BP Location: Right Arm)   Pulse (!) 107   Temp 98.8 F (37.1 C) (Axillary)   Resp 20   Ht 5' 9"  (1.753 m)   Wt 73.6 kg (162 lb 4.1 oz)   SpO2 90%   BMI 23.96 kg/m  Pain Assessment: PAINAD   Pain Score: Asleep   SpO2: SpO2: 90 % O2 Device:SpO2: 90 % O2 Flow Rate: .O2 Flow Rate (L/min): 2 L/min  IO: Intake/output summary:  Intake/Output Summary (Last 24 hours) at 01/22/17 1213 Last data filed at 01/22/17 0403  Gross per 24 hour  Intake            16.17 ml  Output              450 ml  Net          -433.83 ml    LBM: Last BM Date: 01/16/17 Baseline Weight: Weight: 74.8 kg (165 lb) Most recent weight: Weight: 73.6 kg (162 lb 4.1 oz)     Palliative Assessment/Data:   Flowsheet Rows     Most Recent Value  Intake Tab  Referral Department  Infectious Disease  Unit at Time of Referral  Med/Surg Unit  Palliative Care Primary Diagnosis  Cancer  Date Notified  01/20/17  Palliative Care Type  New Palliative  care  Reason for referral  Clarify Goals of Care, End of Life Care Assistance, Counsel Regarding Hospice  Date of Admission  01/24/2017  Date first seen by Palliative Care  01/22/17  # of days Palliative referral response time  2 Day(s)  # of days IP prior to Palliative referral  3  Clinical Assessment  Palliative Performance  Scale Score  10%  Psychosocial & Spiritual Assessment  Palliative Care Outcomes  Patient/Family meeting held?  Yes  Who was at the meeting?  patient, 2 daughters, neice  Palliative Care Outcomes  Clarified goals of care, Changed to focus on comfort, Counseled regarding hospice      Time In: 11:30 Time Out: 12:30 Time Total: 60 min. Greater than 50%  of this time was spent counseling and coordinating care related to the above assessment and plan.  Signed by: Imogene Burn, PA-C Palliative Medicine Pager: [phone]  Please contact Palliative Medicine Team phone at [phone] for questions and concerns.  For individual provider: See Shea Evans

## 2017-01-22 NOTE — Progress Notes (Signed)
[name]   DOB:[birth year]   RK#:[id number]    Subjective: Given the significant poorly controlled pain; patient is currently on morphine drip for better pain control. He is drowsy; unable to given history.  Objective:  Vitals:   01/21/17 0428 01/22/17 0403  BP: 113/61 114/64  Pulse: (!) 102 (!) 107  Resp: 20 20  Temp: 98.1 F (36.7 C) 98.8 F (37.1 C)     Intake/Output Summary (Last 24 hours) at 01/22/17 1657 Last data filed at 01/22/17 1435  Gross per 24 hour  Intake            16.17 ml  Output              675 ml  Net          -658.83 ml    GENERAL: Moderately built.ill -nourished male patient appears cachectic no distress and comfortable.   accompanied by family. EYES: no pallor or icterus OROPHARYNX: Cannot Examined NECK: supple, no masses felt LUNGS: decreased breath sounds to auscultation at bases and  No wheeze or crackles HEART/CVS: regular rate & rhythm and no murmurs; No lower extremity edema ABDOMEN: abdomen soft, non-tender and normal bowel sounds Musculoskeletal:no cyanosis of digits and no clubbing  PSYCH:  Drowsy NEURO: no focal motor/sensory deficits SKIN: Multiple ecchymosis noted. No bleeding noted.    Labs:  Lab Results  Component Value Date   WBC 1.3 (LL) 01/21/2017   HGB 10.6 (L) 01/21/2017   HCT 30.6 (L) 01/21/2017   MCV 94.7 01/21/2017   PLT 14 (LL) 01/21/2017   NEUTROABS 1.1 (L) 01/21/2017    Lab Results  Component Value Date   NA 132 (L) 01/21/2017   K 5.3 (H) 01/21/2017   CL 107 01/21/2017   CO2 19 (L) 01/21/2017    Studies:  No results found.  Assessment & Plan:   71 year old male patient with a history of metastatic lung cancer admitted to the hospital for sepsis/ pneumonia  # Metastatic lung cancer status post 1 cycle of [place] chemotherapy. Poor tolerance to chemotherapy with severe bone marrow suppression/ severe thrombocytopenia. Long discussion with the patient daughter- who feels her father does not want any  treatment given the decline in the performance status/quality of life. I think it's reasonable to involve palliative Care/discuss hospice.  # sepsis/pneumonia- on broad-spectrum antibiotics. No significant clinical improvement noted.  # Chronic pain worse secondary to progressive malignancy. On morphine drip.   # discussed with the patient's daughter Threasa Beards in detail. Also spoke to palliative Care nurse.   Cammie Sickle, MD 01/22/2017  4:57 PM
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
## 2017-01-22 NOTE — Care Management Important Message (Signed)
Important Message  Patient Details  Name: Thi Sisemore MRN: [id number] Date of Birth: 29-Mar-1946   Medicare Important Message Given:  Yes    Shelbie Ammons, RN 01/22/2017, 10:38 AM

## 2017-01-23 ENCOUNTER — Ambulatory Visit: Payer: Medicare Other

## 2017-01-23 MED ORDER — HALOPERIDOL LACTATE 5 MG/ML IJ SOLN
1.0000 mg | Freq: Once | INTRAMUSCULAR | Status: AC
  Administered 2017-01-23: 1 mg via INTRAVENOUS
  Filled 2017-01-23: qty 1

## 2017-01-23 MED ORDER — LORAZEPAM 2 MG/ML IJ SOLN: 2.0000 mg | INTRAMUSCULAR | Status: DC | PRN

## 2017-01-24 ENCOUNTER — Ambulatory Visit: Payer: Medicare Other

## 2017-01-25 ENCOUNTER — Ambulatory Visit: Payer: Medicare Other

## 2017-01-26 ENCOUNTER — Ambulatory Visit: Payer: Medicare Other

## 2017-01-29 ENCOUNTER — Ambulatory Visit: Payer: Medicare Other

## 2017-01-30 ENCOUNTER — Ambulatory Visit: Payer: Medicare Other

## 2017-01-31 ENCOUNTER — Ambulatory Visit: Payer: Medicare Other

## 2017-02-01 ENCOUNTER — Ambulatory Visit: Payer: Medicare Other

## 2017-02-01 ENCOUNTER — Other Ambulatory Visit: Payer: Medicare Other

## 2017-02-01 ENCOUNTER — Ambulatory Visit: Payer: Medicare Other | Admitting: Internal Medicine

## 2017-02-02 ENCOUNTER — Encounter: Payer: Self-pay | Admitting: Internal Medicine

## 2017-02-02 ENCOUNTER — Ambulatory Visit: Payer: Medicare Other

## 2017-02-06 ENCOUNTER — Ambulatory Visit: Payer: Medicare Other
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
## 2017-02-09 NOTE — Progress Notes (Signed)
Responded to a request of a family member to see patient. Patient seen on bed, on semi fowlers position, unresponsive, no rise and fall of chest noted. No apical pulse noted. Writer together with another RN pronounced patient as expired. Family members at bedside. MD Hugelmeyer notified of the death. AC also notified. Called [facility] for referral, given a full release of the body as patient is not a candidate for organ donation. Family declined Chaplain services at this time.

## 2017-02-09 DEATH — deceased

## 2017-02-12 ENCOUNTER — Telehealth: Payer: Self-pay | Admitting: *Deleted

## 2017-02-12 NOTE — Telephone Encounter (Signed)
[employer] [facility] left msgs x 3 on RN's vm bx Friday 6/1 and Monday 6/4. Patient's [employer] testing required a certificate of medical necessity. [employer] calling to see if this was received. I explained that MD has not received this form at this time. RN spoke with Dr. Rogue Bussing on 6/1-MD asked that RN follow-up with [facility] on Monday - 6/4 to see if this cert. Of med. Necessity is now necessary as patient is deceased.  Per [employer], this form is still required. date of death provided (for documentation purposes) to [employer].  The representative will be faxing a new form for md to sign. I also explained that md would not return until 6/25 to office.

## 2017-03-17 ENCOUNTER — Other Ambulatory Visit: Payer: Self-pay | Admitting: Nurse Practitioner

## 2017-09-17 IMAGING — DX DG CHEST 1V PORT
1 series · 2 of 2 positions shown · non-contrast
Comparison: Head CT 12/18/2016

CLINICAL DATA: Preop for left hip fracture.

EXAM:
PORTABLE CHEST 1 VIEW

[Series 1: chest ap · 0.14mm/px · 2 of 2 slices shown]
[im 1/2]
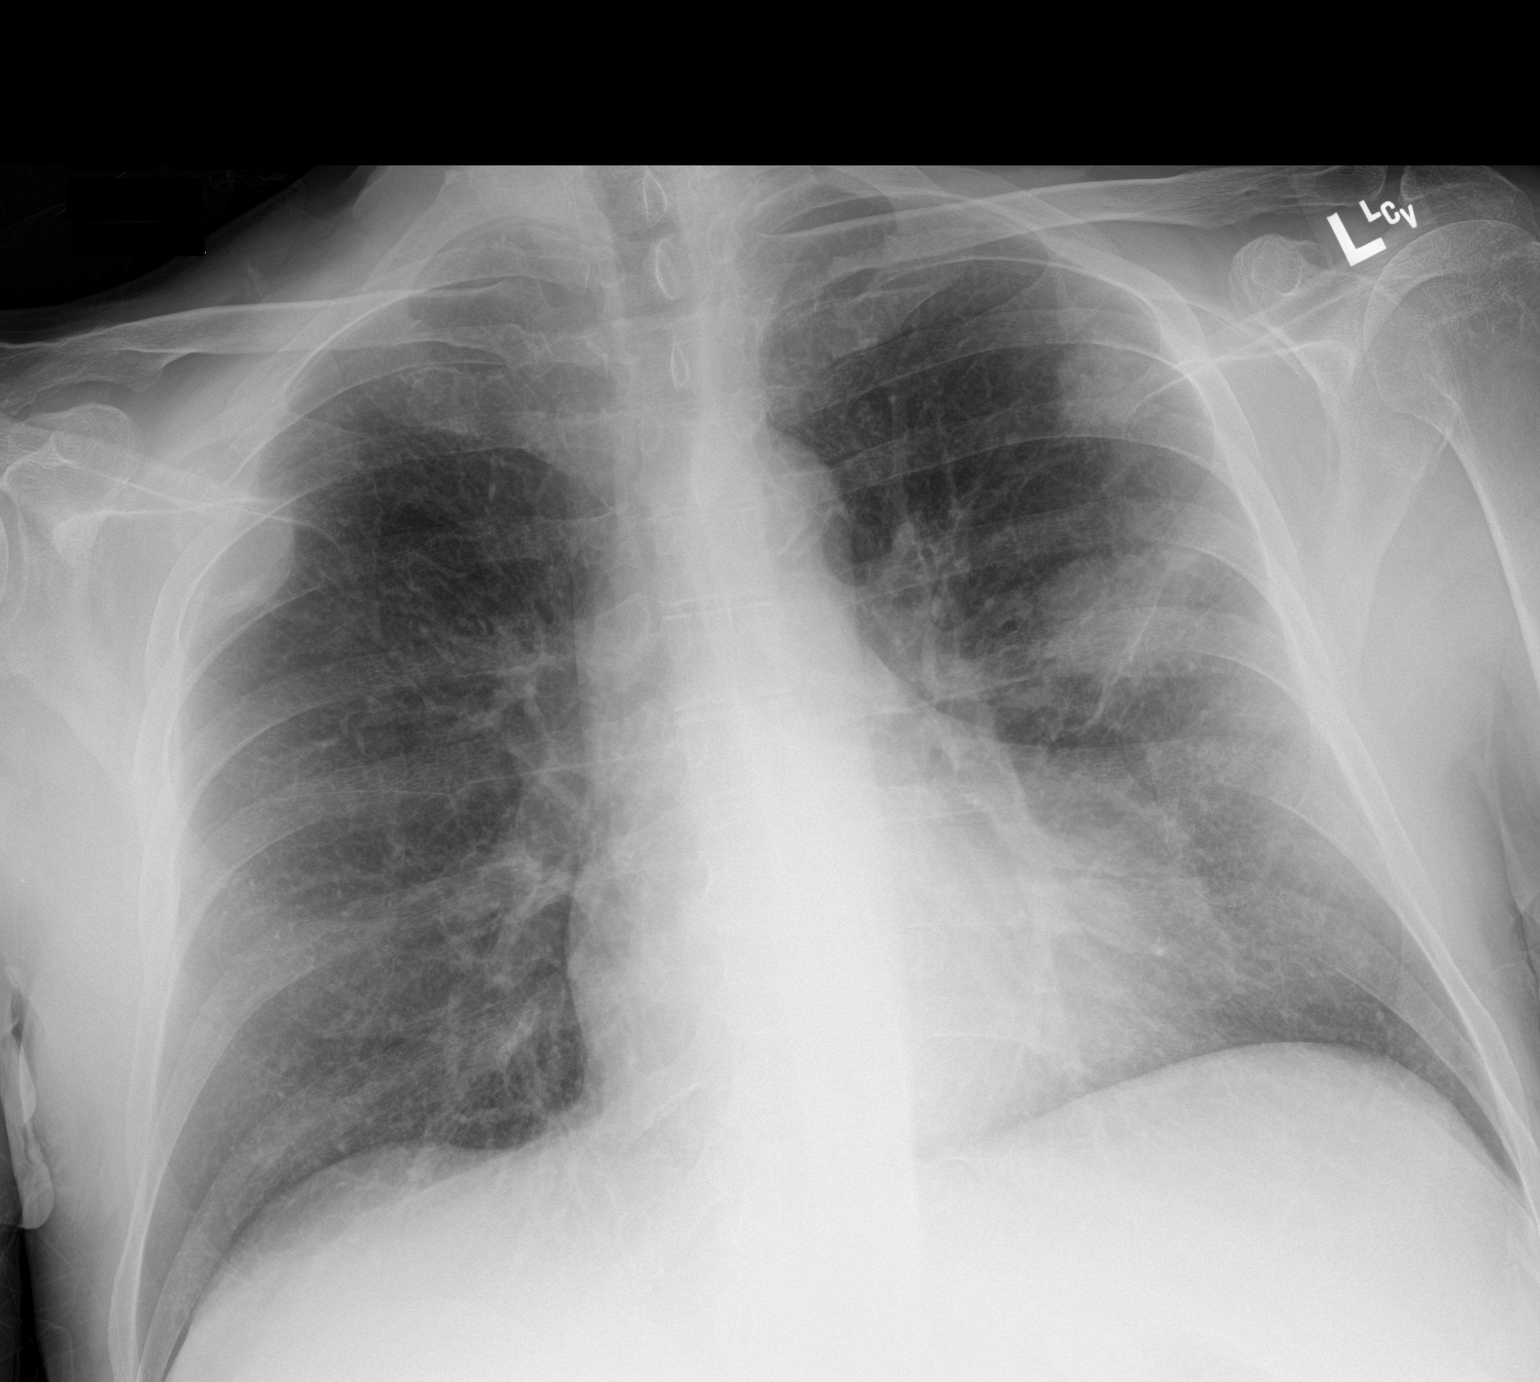
[im 2/2]
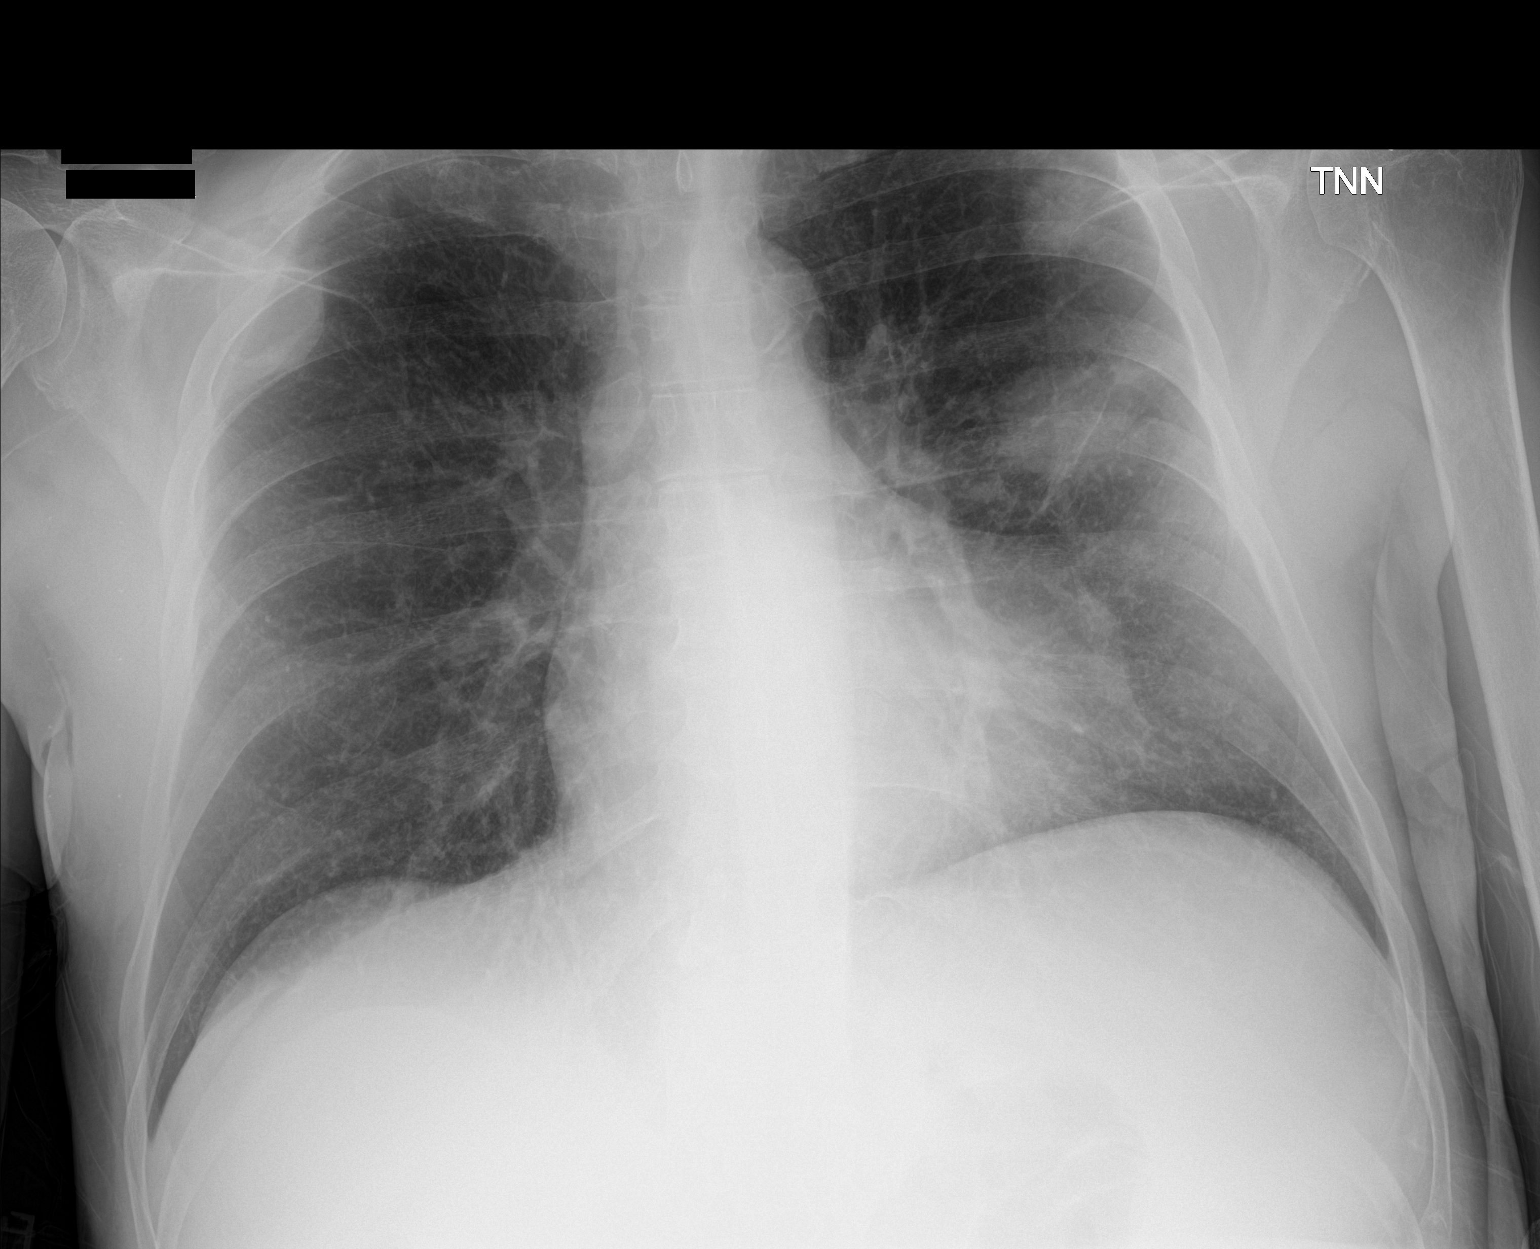

[2 of 2 positions shown; findings below may reference images not displayed]

FINDINGS: 2 left lung mass and right third rib metastasis with pleural
disease, known from recent PET-CT. No cardiomegaly. There is no
edema, consolidation, effusion, or pneumothorax. No acute fracture
IMPRESSION: 1. No acute finding.
2. Known left lung masses and right third rib metastasis.

## 2017-09-18 IMAGING — XA DG HIP (WITH PELVIS) OPERATIVE*L*
1 series · 4 of 4 positions shown · non-contrast
Comparison: None.

CLINICAL DATA: Left hip surgery

EXAM:
OPERATIVE LEFT HIP (WITH PELVIS IF PERFORMED) 1 VIEWS
TECHNIQUE: Fluoroscopic spot image(s) were submitted for interpretation
post-operatively.

[Series 8: ortho standard · 4 of 9 frames shown]
[frame 1/9]
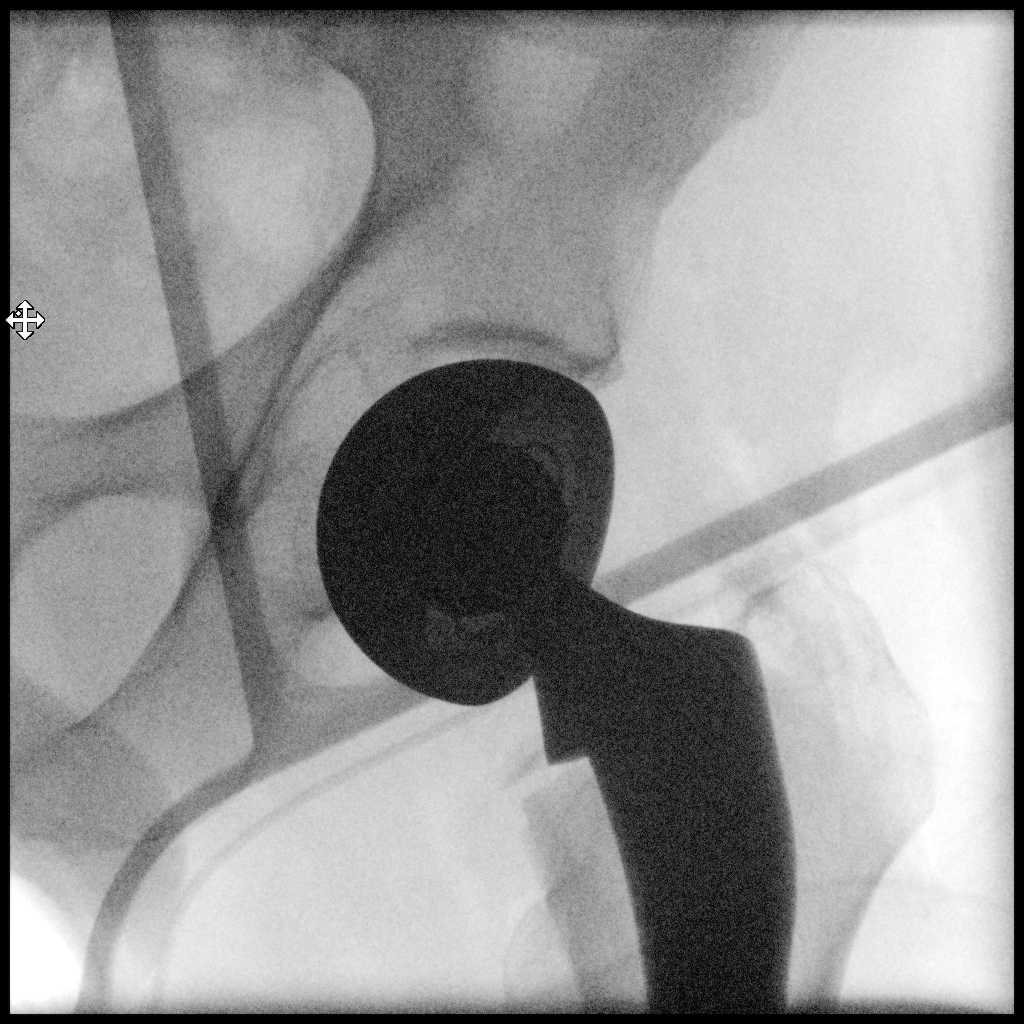
[frame 2/9]
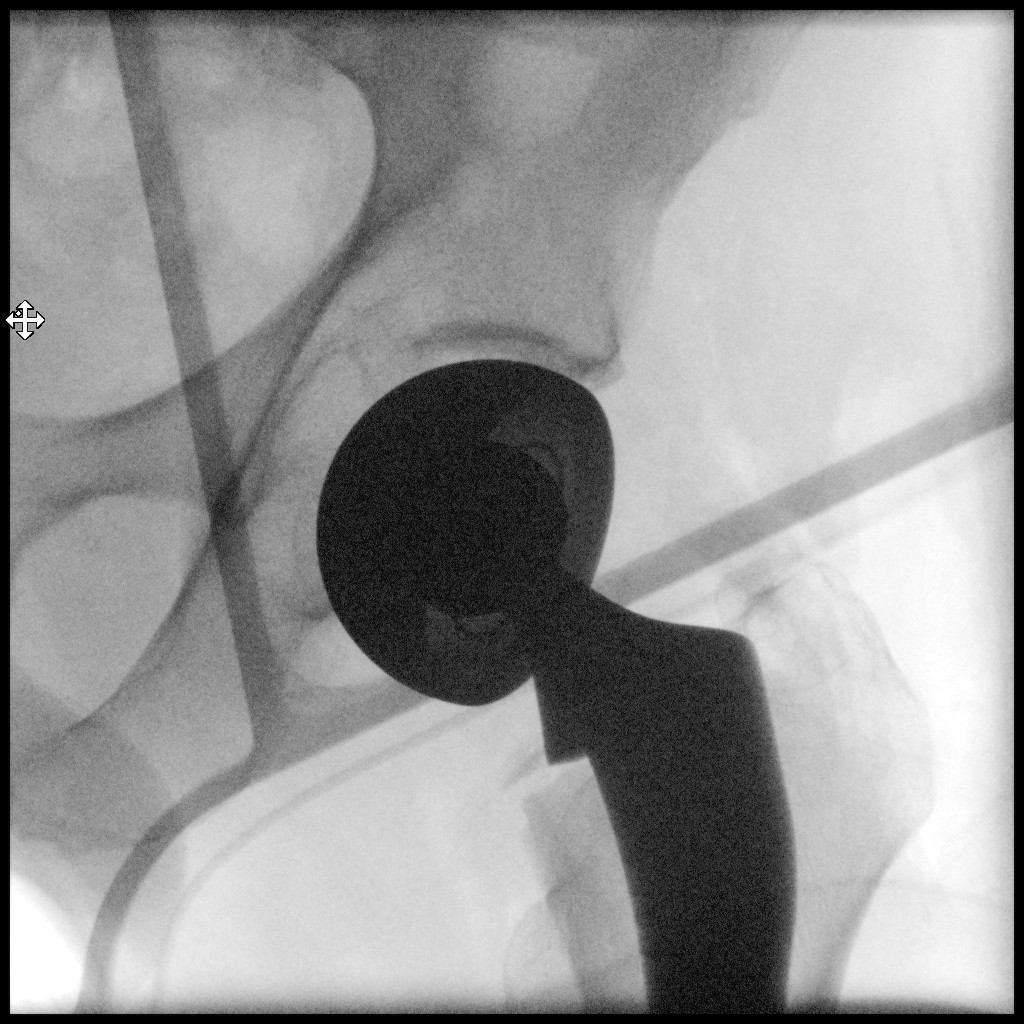
[frame 5/9]
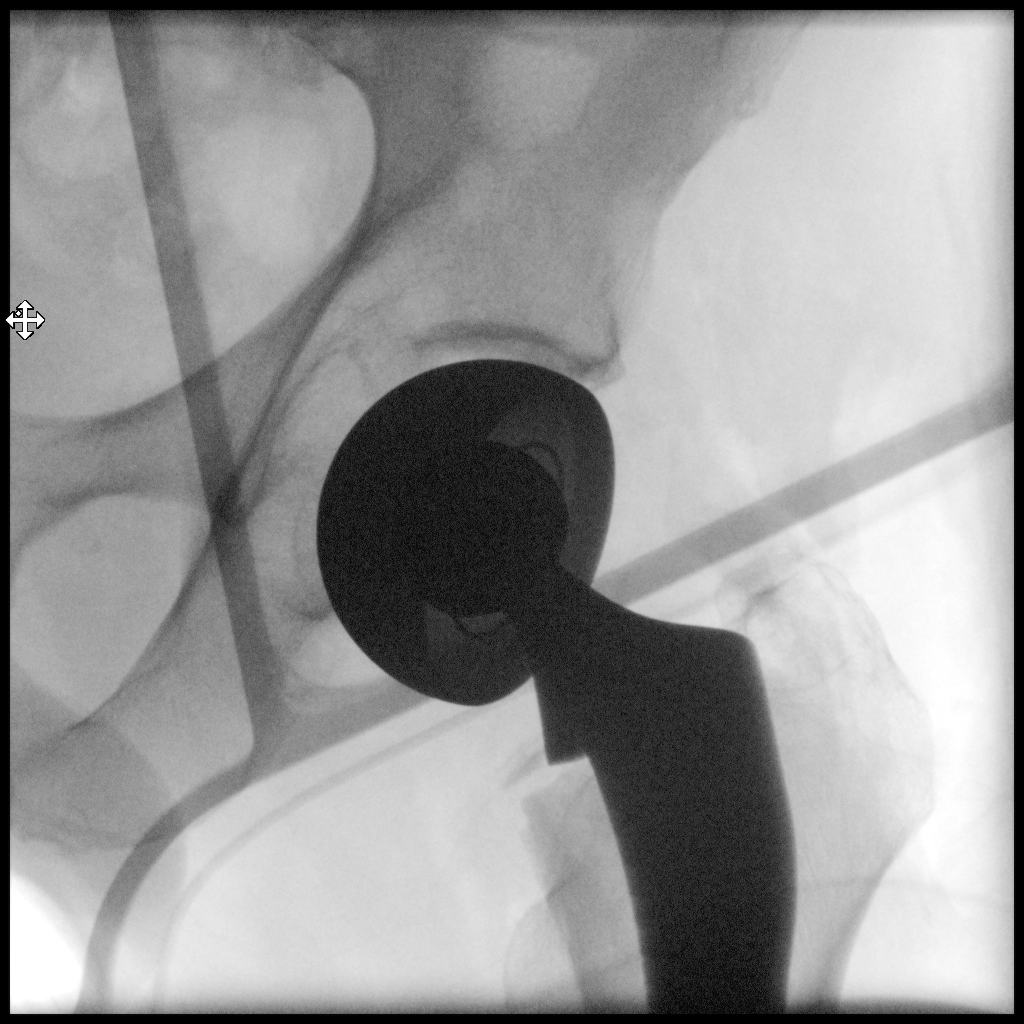
[frame 8/9]
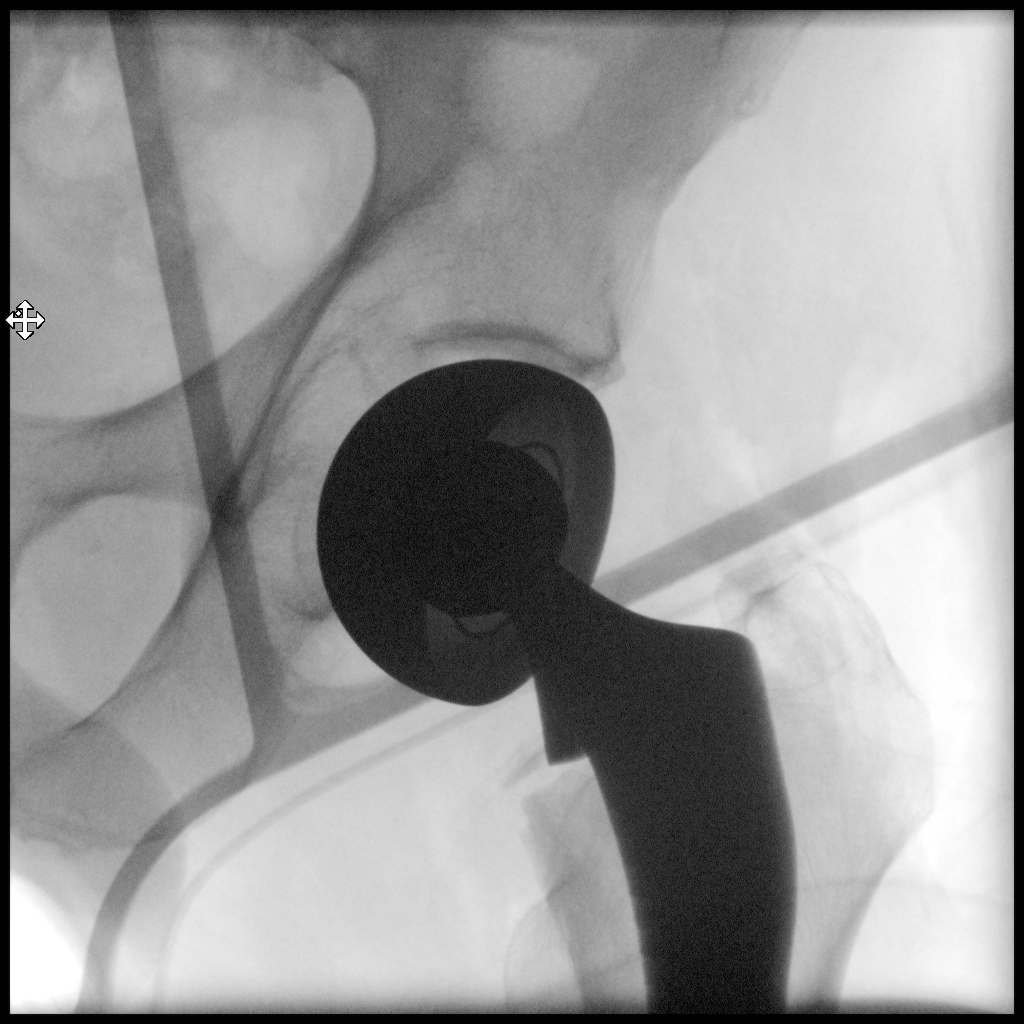

[4 of 4 positions shown; findings below may reference images not displayed]

FINDINGS: Left hip arthroplasty without failure complication. No fracture or
dislocation.
IMPRESSION: Left hip arthroplasty.
